# Patient Record
Sex: Male | Born: 1975 | Race: White | Hispanic: No | Marital: Single | Smoking: Never smoker
Health system: Southern US, Community
[De-identification: ages and names within clinical notes are randomized; demographics above are authoritative.]

## PROBLEM LIST (undated history)

## (undated) DIAGNOSIS — R002 Palpitations: Secondary | ICD-10-CM

## (undated) DIAGNOSIS — E669 Obesity, unspecified: Secondary | ICD-10-CM

## (undated) HISTORY — DX: Obesity, unspecified: E66.9

## (undated) HISTORY — DX: Palpitations: R00.2

## (undated) HISTORY — PX: LEG SURGERY: SHX1003

---

## 2015-09-27 ENCOUNTER — Encounter (HOSPITAL_COMMUNITY): Payer: Self-pay | Admitting: *Deleted

## 2015-09-27 ENCOUNTER — Emergency Department (HOSPITAL_COMMUNITY)
Admission: EM | Admit: 2015-09-27 | Discharge: 2015-09-27 | Disposition: A | Discharge status: Home / Self Care | Payer: Worker's Compensation | Attending: Emergency Medicine | Admitting: Emergency Medicine

## 2015-09-27 DIAGNOSIS — Y998 Other external cause status: Secondary | ICD-10-CM | POA: Insufficient documentation

## 2015-09-27 DIAGNOSIS — Z91040 Latex allergy status: Secondary | ICD-10-CM | POA: Diagnosis not present

## 2015-09-27 DIAGNOSIS — S0101XA Laceration without foreign body of scalp, initial encounter: Secondary | ICD-10-CM | POA: Insufficient documentation

## 2015-09-27 DIAGNOSIS — Y9389 Activity, other specified: Secondary | ICD-10-CM | POA: Insufficient documentation

## 2015-09-27 DIAGNOSIS — Y9289 Other specified places as the place of occurrence of the external cause: Secondary | ICD-10-CM | POA: Insufficient documentation

## 2015-09-27 DIAGNOSIS — W2209XA Striking against other stationary object, initial encounter: Secondary | ICD-10-CM | POA: Insufficient documentation

## 2015-09-27 DIAGNOSIS — S0990XA Unspecified injury of head, initial encounter: Secondary | ICD-10-CM | POA: Diagnosis present

## 2015-09-27 MED ORDER — LIDOCAINE-EPINEPHRINE-TETRACAINE (LET) SOLUTION
3.0000 mL | Freq: Once | NASAL | Status: AC
  Administered 2015-09-27: 3 mL via TOPICAL
  Filled 2015-09-27: qty 3

## 2015-09-27 MED ORDER — IBUPROFEN 400 MG PO TABS
800.0000 mg | ORAL_TABLET | Freq: Once | ORAL | Status: AC
  Administered 2015-09-27: 800 mg via ORAL
  Filled 2015-09-27: qty 2

## 2015-09-27 NOTE — ED Provider Notes (Signed)
CSN: [id number]     Arrival date & time 09/27/15  0032 History   First MD Initiated Contact with Patient 09/27/15 0103     Chief Complaint  Patient presents with  . Head Laceration     (Consider location/radiation/quality/duration/timing/severity/associated sxs/prior Treatment) The history is provided by the patient.   Jeremy Roberson is a 40 y.o. male presenting with head laceration.  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  He sustained a small laceration which bled copiously, but has now resolved with pressure application.  He denies loc, dizziness, nausea, vision changes, local weakness,  and denies neck pain. He does endorse a mild 3/10 headache since the event. His tetanus is utd.    No past medical history on file. Past Surgical History  Procedure Laterality Date  . Leg surgery     No family history on file. Social History  Substance Use Topics  . Smoking status: Never Smoker   . Smokeless tobacco: Not on file  . Alcohol Use: Yes    Review of Systems  Constitutional: Negative.   Eyes: Negative for visual disturbance.  Respiratory: Negative.   Cardiovascular: Negative.   Gastrointestinal: Negative.  Negative for nausea.  Musculoskeletal: Negative.  Negative for neck pain.  Skin: Positive for wound.  Neurological: Positive for headaches. Negative for weakness and numbness.      Allergies  Latex  Home Medications   Prior to Admission medications   Not on File   BP 139/86 mmHg  Pulse 101  Temp(Src) 98.6 F (37 C)  Resp 18  SpO2 95% Physical Exam  Constitutional: He is oriented to person, place, and time. He appears well-developed and well-nourished.  HENT:  Head: Normocephalic.  Eyes: EOM are normal.  Neck: Normal range of motion. Neck supple.  nontender to palpation  Cardiovascular: Normal rate.   Pulmonary/Chest: Effort normal.  Musculoskeletal: Normal range of motion.   Neurological: He is alert and oriented to person, place, and time. No sensory deficit.  Skin: Laceration noted.  1 cm laceration, approximated and hemostatic parietal scalp. No palpable skull deformity.    ED Course  Procedures (including critical care time)  LACERATION REPAIR Performed by: Burgess AmorIDOL, Maisley Hainsworth Authorized by: Burgess AmorIDOL, Leary Mcnulty Consent: Verbal consent obtained. Risks and benefits: risks, benefits and alternatives were discussed Consent given by: patient Patient identity confirmed: provided demographic data Prepped and Draped in normal sterile fashion Wound explored  Laceration Location: scalp  Laceration Length: 1 cm  No Foreign Bodies seen or palpated  Anesthesia: topical LET Local anesthetic: LET Anesthetic total: 3 ml applied topically  Irrigation method: syringe Amount of cleaning: standard after using betadine.  Wound site at an area of sparse hair growth amenable to suturing.  Skin closure: ethilon 4-0  Number of sutures: 3  Technique: simple interupted  Patient tolerance: Patient tolerated the procedure well with no immediate complications.  Labs Review Labs Reviewed - No data to display  Imaging Review No results found. I have personally reviewed and evaluated these images and lab results as part of my medical decision-making.   EKG Interpretation None      MDM   Final diagnoses:  Laceration of scalp, initial encounter    Wound care instructions given.  Pt advised to have sutures removed in 10 days,  Return here sooner for any signs of infection including redness, swelling, worse pain or drainage of pus.  Burgess Amor, PA-C 09/27/15 1358  Loren Racer, MD 09/28/15 [phone]

## 2015-09-27 NOTE — Discharge Instructions (Signed)

## 2015-09-27 NOTE — ED Notes (Signed)
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 ~3cm lac noted to top of head. Bleeding controlled at this time. No LOC

## 2015-09-27 NOTE — ED Notes (Signed)
PA at bedside for suturing 

## 2018-05-13 DIAGNOSIS — Z0000 Encounter for general adult medical examination without abnormal findings: Secondary | ICD-10-CM | POA: Diagnosis not present

## 2018-05-13 DIAGNOSIS — E669 Obesity, unspecified: Secondary | ICD-10-CM | POA: Diagnosis not present

## 2018-05-13 DIAGNOSIS — Z13220 Encounter for screening for lipoid disorders: Secondary | ICD-10-CM | POA: Diagnosis not present

## 2019-12-01 ENCOUNTER — Emergency Department: Payer: 59

## 2019-12-01 ENCOUNTER — Encounter: Payer: Self-pay | Admitting: *Deleted

## 2019-12-01 DIAGNOSIS — I4891 Unspecified atrial fibrillation: Secondary | ICD-10-CM | POA: Diagnosis not present

## 2019-12-01 DIAGNOSIS — R002 Palpitations: Secondary | ICD-10-CM | POA: Diagnosis present

## 2019-12-01 DIAGNOSIS — R079 Chest pain, unspecified: Secondary | ICD-10-CM | POA: Insufficient documentation

## 2019-12-01 DIAGNOSIS — Z91040 Latex allergy status: Secondary | ICD-10-CM | POA: Diagnosis not present

## 2019-12-01 LAB — BASIC METABOLIC PANEL
Anion gap: 9 (ref 5–15)
BUN: 16 mg/dL (ref 6–20)
CO2: 22 mmol/L (ref 22–32)
Calcium: 9.2 mg/dL (ref 8.9–10.3)
Chloride: 111 mmol/L (ref 98–111)
Creatinine, Ser: 1.04 mg/dL (ref 0.61–1.24)
GFR calc Af Amer: >60 mL/min (ref >60)
GFR calc non Af Amer: >60 mL/min (ref >60)
Glucose, Bld: 113 mg/dL — ABNORMAL HIGH (ref 70–99)
Potassium: 3.6 mmol/L (ref 3.5–5.1)
Sodium: 142 mmol/L (ref 135–145)

## 2019-12-01 LAB — CBC
HCT: 46.3 % (ref 39.0–52.0)
Hemoglobin: 16.4 g/dL (ref 13.0–17.0)
MCH: 32.2 pg (ref 26.0–34.0)
MCHC: 35.4 g/dL (ref 30.0–36.0)
MCV: 91 fL (ref 80.0–100.0)
Platelets: 239 10*3/uL (ref 150–400)
RBC: 5.09 MIL/uL (ref 4.22–5.81)
RDW: 12.9 % (ref 11.5–15.5)
WBC: 8.8 10*3/uL (ref 4.0–10.5)
nRBC: 0 % (ref 0.0–0.2)

## 2019-12-01 MED ORDER — SODIUM CHLORIDE 0.9% FLUSH: 3.0000 mL | Freq: Once | INTRAVENOUS | Status: DC

## 2019-12-01 NOTE — ED Triage Notes (Signed)
Pt to ED after feeling "fluttering" in his chest this afternoon. 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 Pt reports he has had multiple episodes like this in the past week and was seen by PCP today and referred to cardiology due to a normal assessment today without symptoms.   Pt reports when his HR is back to baseline her is feeling "fine" with a headache. But pt is having PVCs on the EKG in triage but is NSR at 97.

## 2019-12-02 ENCOUNTER — Other Ambulatory Visit: Payer: Self-pay

## 2019-12-02 ENCOUNTER — Ambulatory Visit (INDEPENDENT_AMBULATORY_CARE_PROVIDER_SITE_OTHER): Payer: 59

## 2019-12-02 ENCOUNTER — Encounter: Payer: Self-pay | Admitting: Cardiovascular Disease

## 2019-12-02 ENCOUNTER — Ambulatory Visit: Payer: 59

## 2019-12-02 ENCOUNTER — Emergency Department
Admission: EM | Admit: 2019-12-02 | Discharge: 2019-12-02 | Disposition: A | Discharge status: Home / Self Care | Payer: 59 | Attending: Emergency Medicine | Admitting: Emergency Medicine

## 2019-12-02 ENCOUNTER — Ambulatory Visit (INDEPENDENT_AMBULATORY_CARE_PROVIDER_SITE_OTHER): Payer: 59 | Admitting: Cardiovascular Disease

## 2019-12-02 VITALS — BP 120/80 | HR 53 | Ht 72.0 in | Wt 268.4 lb

## 2019-12-02 DIAGNOSIS — R9431 Abnormal electrocardiogram [ECG] [EKG]: Secondary | ICD-10-CM | POA: Diagnosis not present

## 2019-12-02 DIAGNOSIS — I479 Paroxysmal tachycardia, unspecified: Secondary | ICD-10-CM

## 2019-12-02 DIAGNOSIS — I480 Paroxysmal atrial fibrillation: Secondary | ICD-10-CM

## 2019-12-02 DIAGNOSIS — R002 Palpitations: Secondary | ICD-10-CM

## 2019-12-02 LAB — T4, FREE: Free T4: 1.09 ng/dL (ref 0.61–1.12)

## 2019-12-02 LAB — TROPONIN I (HIGH SENSITIVITY)
Troponin I (High Sensitivity): 6 ng/L (ref <18)
Troponin I (High Sensitivity): 6 ng/L (ref <18)

## 2019-12-02 LAB — MAGNESIUM: Magnesium: 2.4 mg/dL (ref 1.7–2.4)

## 2019-12-02 LAB — TSH: TSH: 1.802 u[IU]/mL (ref 0.350–4.500)

## 2019-12-02 MED ORDER — PROPRANOLOL HCL 20 MG PO TABS: 20.0000 mg | ORAL_TABLET | Freq: Three times a day (TID) | ORAL | 3 refills | Status: DC | PRN

## 2019-12-02 MED ORDER — ASPIRIN 81 MG PO CHEW
81.0000 mg | CHEWABLE_TABLET | Freq: Once | ORAL | Status: AC
  Administered 2019-12-02: 81 mg via ORAL
  Filled 2019-12-02: qty 1

## 2019-12-02 NOTE — Discharge Instructions (Signed)
Take a baby aspirin a day. Follow-up with cardiology within a week. Return to the emergency room for chest pain, shortness of breath or dizziness.

## 2019-12-02 NOTE — Progress Notes (Signed)
Cardiology Office Note  Date:  12/02/2019   ID:  Reeder, Brisby 01-09-1976, MRN [id number]  PCP:  Blair Heys, MD   Cc: Paroxysmal tachycardia  HPI:  Mr. Jeremy Roberson is a 44 year old gentleman with past medical history of Morbid obesity Presenting for evaluation of bradycardia, abnormal EKGs  Seen in emergency room today for chest pain and palpitations Works as a paramedic Pulses elevated at times Did an EKG showing normal sinus rhythm with narrow complex tachycardia episodes  Seen in outside emergency room 5 days ago with negative work-up and was referred to cardiology at that time  Last Tuesday, appreciated palpitations, Rate on tele strip 170 bpm Broke to 120 bpm, BP 130 systolic  Past Sunday, had nausea, pain back flank, scapula EKG NSR rate 60 bpm Went to [facility] hospital, TNT negative In the ER had PVCs, felt them  resting heart rate 44 to 50s, BP stable  Yesterday, 12/01/2019,  Tachycardia  Rhythm strips available,, copies have been made and placed in the chart  Weight down 30 pounds, change in diet  Possible apnea, Started walking for exercise and weight loss  EKG today showing sinus bradycardia rate 53 bpm no significant ST abnormality, nonspecific T wave abnormality 3 and aVF (unclear if lead placement issue) Recent EKG done in the emergency room shows no T wave abnormality  Recent telemetry strips reviewed that he has brought in showing narrow complex tachycardia Unable to exclude atrial fibrillation versus flutter, select strips concerning for atrial tachycardia even SVT with rate up to 170   PMH:   has no past medical history on file.  PSH:    Past Surgical History:  Procedure Laterality Date  . LEG SURGERY      Current Outpatient Medications  Medication Sig Dispense Refill  . ibuprofen (ADVIL) 200 MG tablet Take by mouth as needed.    . loratadine (CLARITIN) 10 MG tablet Take by mouth daily as needed.    . melatonin 5 MG TABS  Take 5 mg by mouth as needed.    . propranolol (INDERAL) 20 MG tablet Take 1 tablet (20 mg total) by mouth 3 (three) times daily as needed (As needed for fast heart rates). 270 tablet 3   No current facility-administered medications for this visit.     Allergies:   Latex   Social History:  The patient  reports that he has never smoked. He has never used smokeless tobacco. He reports current alcohol use. He reports that he does not use drugs.   Family History:   family history is not on file.    Review of Systems: Review of Systems  Constitutional: Negative.   HENT: Negative.   Respiratory: Negative.   Cardiovascular: Positive for chest pain and palpitations.  Gastrointestinal: Negative.   Musculoskeletal: Negative.   Neurological: Negative.   Psychiatric/Behavioral: Negative.   All other systems reviewed and are negative.   PHYSICAL EXAM: VS:  BP 120/80   Pulse 53   Ht 6' (1.829 m)   Wt (!) 268 lb 6.4 oz (121.7 kg)   BMI 36.40 kg/m  , BMI Body mass index is 36.4 kg/m. GEN: Well nourished, well developed, in no acute distress HEENT: normal Neck: no JVD, carotid bruits, or masses Cardiac: RRR; no murmurs, rubs, or gallops,no edema  Respiratory:  clear to auscultation bilaterally, normal work of breathing GI: soft, nontender, nondistended, + BS MS: no deformity or atrophy Skin: warm and dry, no rash Neuro:  Strength and sensation are intact Psych:  euthymic mood, full affect    Recent Labs: 12/01/2019: BUN 16; Creatinine, Ser 1.04; Hemoglobin 16.4; Magnesium 2.4; Platelets 239; Potassium 3.6; Sodium 142; TSH 1.802    Lipid Panel No results found for: CHOL, HDL, LDLCALC, TRIG    Wt Readings from Last 3 Encounters:  12/02/19 (!) 268 lb 6.4 oz (121.7 kg)       ASSESSMENT AND PLAN:  Problem List Items Addressed This Visit    None    Visit Diagnoses    Paroxysmal tachycardia (HCC)    -  Primary   Relevant Medications   propranolol (INDERAL) 20 MG tablet    Nonspecific abnormal electrocardiogram (ECG) (EKG)       Relevant Orders   EKG 12-Lead   Morbid obesity (HCC)         Paroxysmal tachycardia Rhythm strips reviewed, select strips concerning for atrial fibrillation/flutter Unable to exclude atrial tachycardia or even SVT For further evaluation we have ordered a Zio monitor Recent stressors include weight loss, strict diet, walking more, possible sleep apnea Recommend he discuss getting sleep apnea testing with primary care -We will give him propranolol 20 mg p.o. 3 times daily as needed to take as needed for tachycardia episodes -For SVT atrial fibrillation or flutter potentially could consider ablation -He has baseline bradycardia, taking medications such as beta-blocker, calcium channel blocker or antiarrhythmics on a regular basis would be challenging and would likely lead to symptoms  Obesity Has had significant weight loss in the past several weeks to months through dietary changes and walking  Abnormal EKG Nonspecific T wave abnormality on EKG Possibly from lead placement, prior EKG was normal  Disposition:   F/U 6 weeks   Total encounter time more than 45 minutes  Greater than 50% was spent in counseling and coordination of care with the patient    Signed, Dossie Arbour, M.D., Ph.D. [facility] [place], [place] [phone]

## 2019-12-02 NOTE — Addendum Note (Signed)
Addended by: Bryna Colander on: 12/02/2019 02:13 PM   Modules accepted: Orders

## 2019-12-02 NOTE — Patient Instructions (Addendum)
Zio monitor for paroxysmal tachycardia  Medication Instructions:  Propranolol 20 mg three times a day as needed for tachycardia  If you need a refill on your cardiac medications before your next appointment, please call your pharmacy.    Lab work: No new labs needed   If you have labs (blood work) drawn today and your tests are completely normal, you will receive your results only by: Marland Kitchen MyChart Message (if you have MyChart) OR . A paper copy in the mail If you have any lab test that is abnormal or we need to change your treatment, we will call you to review the results.   Testing/Procedures: Your physician has recommended that you wear a Zio monitor. This monitor is a medical device that records the heart's electrical activity. Doctors most often use these monitors to diagnose arrhythmias. Arrhythmias are problems with the speed or rhythm of the heartbeat. The monitor is a small device applied to your chest. You can wear one while you do your normal daily activities. While wearing this monitor if you have any symptoms to push the button and record what you felt. Once you have worn this monitor for the period of time provider prescribed (Usually 14 days), you will return the monitor device in the postage paid box. Once it is returned they will download the data collected and provide [place] with a report which the provider will then review and we will call you with those results. Important tips:  1. Avoid showering during the first 24 hours of wearing the monitor. 2. Avoid excessive sweating to help maximize wear time. 3. Do not submerge the device, no hot tubs, and no swimming pools. 4. Keep any lotions or oils away from the patch. 5. After 24 hours you may shower with the patch on. Take brief showers with your back facing the shower head.  6. Do not remove patch once it has been placed because that will interrupt data and decrease adhesive wear time. 7. Push the button when you have any  symptoms and write down what you were feeling. 8. Once you have completed wearing your monitor, remove and place into box which has postage paid and place in your outgoing mailbox.  9. If for some reason you have misplaced your box then call our office and we can provide another box and/or mail it off for you.         Follow-Up: At [facility], you and your health needs are our priority.  As part of our continuing mission to provide you with exceptional heart care, we have created designated Provider Care Teams.  These Care Teams include your primary Cardiologist (physician) and Advanced Practice Providers (APPs -  Physician Assistants and Nurse Practitioners) who all work together to provide you with the care you need, when you need it.  . You will need a follow up appointment in 6 to 8 weeks    . Providers on your designated Care Team:   . Nicolasa Ducking, NP . Eula Listen, PA-C . Marisue Ivan, PA-C  Any Other Special Instructions Will Be Listed Below (If Applicable).  For educational health videos Log in to : [email or web address] Or : [email or web address], password : [email or web address]

## 2019-12-02 NOTE — ED Notes (Signed)
Pt states chest pain 1/10 to the left chest. Pt states the provider wanted an EKG if he had chest pain. EKG captured and given to provider for review. Pt declined any interventions from RN

## 2019-12-02 NOTE — ED Provider Notes (Signed)
[facility] Emergency Department Provider Note  ____________________________________________  Time seen: Approximately 3:30 AM  I have reviewed the triage vital signs and the nursing notes.   HISTORY  Chief Complaint Atrial Fibrillation and Palpitations   HPI Jeremy Roberson is a 44 y.o. male no significant past medical history who presents for evaluation of chest pain and palpitations.  Patient has had several short-lived episodes over the last week of palpitations and chest pressure.  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  He checked his vitals and noticed that his pulse was elevated.  He did an EKG which showed A. fib with RVR.  Patient denies any personal history of such but his mother has a history of A. fib.  Patient had mild chest discomfort associated with it.  No dizziness, shortness of breath, nausea vomiting, diarrhea, fever chills, cough.  Patient denies history of alcohol abuse or smoking.  No personal or family history of blood clots, no recent travel immobilization, no leg pain or swelling, no hemoptysis or exogenous hormones.  Symptoms have resolved.   Patient recently seen at an outside emergency department 5 days ago for the same with negative work-up.  Was referred to cardiology but has not been able to schedule an appointment yet.  PMH Elevated BMI  Past Surgical History:  Procedure Laterality Date   LEG SURGERY      Prior to Admission medications   Not on File    Allergies Latex  FH Mother - afob  Social History Social History   Tobacco Use   Smoking status: Never Smoker   Smokeless tobacco: Never Used  Substance Use Topics   Alcohol use: Yes   Drug use: No    Review of Systems  Constitutional: Negative for fever. Eyes: Negative for visual changes. ENT: Negative for sore throat. Neck: No neck pain  Cardiovascular: + chest pain and  palpitations Respiratory: Negative for shortness of breath. Gastrointestinal: Negative for abdominal pain, vomiting or diarrhea. Genitourinary: Negative for dysuria. Musculoskeletal: Negative for back pain. Skin: Negative for rash. Neurological: Negative for headaches, weakness or numbness. Psych: No SI or HI  ____________________________________________   PHYSICAL EXAM:  VITAL SIGNS: ED Triage Vitals  Enc Vitals Group     BP 12/02/19 0315 126/85     Pulse Rate 12/02/19 0315 54     Resp 12/02/19 0315 14     Temp 12/02/19 0320 98 F (36.7 C)     Temp src --      SpO2 12/02/19 0315 100 %     Weight --      Height --      Head Circumference --      Peak Flow --      Pain Score 12/02/19 0315 0     Pain Loc --      Pain Edu? --      Excl. in GC? --     Constitutional: Alert and oriented. Well appearing and in no apparent distress. HEENT:      Head: Normocephalic and atraumatic.         Eyes: Conjunctivae are normal. Sclera is non-icteric.       Mouth/Throat: Mucous membranes are moist.       Neck: Supple with no signs of meningismus. Cardiovascular: Regular rate and rhythm. No murmurs, gallops, or rubs. 2+ symmetrical distal pulses are present in all extremities. No JVD. Respiratory: Normal respiratory effort. Lungs  are clear to auscultation bilaterally.  Gastrointestinal: Soft, non tender. Musculoskeletal:  No edema, cyanosis, or erythema of extremities. Neurologic: Normal speech and language. Face is symmetric. Moving all extremities. No gross focal neurologic deficits are appreciated. Skin: Skin is warm, dry and intact. No rash noted. Psychiatric: Mood and affect are normal. Speech and behavior are normal.  ____________________________________________   LABS (all labs ordered are listed, but only abnormal results are displayed)  Labs Reviewed  BASIC METABOLIC PANEL - Abnormal; Notable for the following components:      Result Value   Glucose, Bld 113 (*)    All  other components within normal limits  CBC  TSH  T4, FREE  MAGNESIUM  TROPONIN I (HIGH SENSITIVITY)  TROPONIN I (HIGH SENSITIVITY)   ____________________________________________  EKG  ED ECG REPORT I, Nita Sickle, the attending physician, personally viewed and interpreted this ECG.  20:38 -normal sinus rhythm, rate of 85, normal intervals, normal axis, no ST elevations or depressions.  Normal EKG.  20:40 -sinus tachycardia with frequent PACs, normal intervals, normal axis, no ST elevations or depressions. ____________________________________________  RADIOLOGY  I have personally reviewed the images performed during this visit and I agree with the Radiologist's read.   Interpretation by Radiologist:  DG Chest 2 View  Result Date: 12/01/2019 CLINICAL DATA:  Cardiac palpitations EXAM: CHEST - 2 VIEW COMPARISON:  None. FINDINGS: The heart size and mediastinal contours are within normal limits. Both lungs are clear. The visualized skeletal structures are unremarkable. IMPRESSION: No active cardiopulmonary disease. Electronically Signed   By: Alcide Clever M.D.   On: 12/01/2019 21:17     ____________________________________________   PROCEDURES  Procedure(s) performed:yes .1-3 Lead EKG Interpretation Performed by: Nita Sickle, MD Authorized by: Nita Sickle, MD     Interpretation: normal     ECG rate assessment: bradycardic     Rhythm: sinus bradycardia     Ectopy: PAC     Critical Care performed:  None ____________________________________________   INITIAL IMPRESSION / ASSESSMENT AND PLAN / ED COURSE  44 y.o. male no significant past medical history who presents for evaluation of chest pain and palpitations.  Patient with intermittent symptoms for the last 5 days.  Had one today while at work and had an EKG showing A. fib with RVR.  At this time patient is back to normal rhythm.  He is sinus bradycardic on telemetry.  EKG showing frequent PACs but no  A. fib.  No electrolyte derangements.  Low suspicion for PE with no persistent chest pain, shortness of breath, hemoptysis, tachycardia, tachypnea, hypoxia, or any other risk factors.  Will check thyroid studies.  Labs with no evidence of dehydration, diabetes, significant electrolyte derangements, cardiac ischemia.  Patient placed on telemetry for close monitoring.  Old medical records reviewed.  Per Chads-Vasc 2 score no indication for anticoagulation. Will start patient on ASA. Will refer to cardiology for Holter monitoring.   _________________________ 4:28 AM on 12/02/2019 -----------------------------------------  Thyroid studies within normal limits. Magnesium is normal. Patient remains on sinus rhythm. Will discharge home with follow-up with cardiology. Discussed my standard return precautions    _____________________________________________ Please note:  Patient was evaluated in Emergency Department today for the symptoms described in the history of present illness. Patient was evaluated in the context of the global COVID-19 pandemic, which necessitated consideration that the patient might be at risk for infection with the SARS-CoV-2 virus that causes COVID-19. Institutional protocols and algorithms that pertain to the evaluation of patients at risk for COVID-19  are in a state of rapid change based on information released by regulatory bodies including the CDC and federal and state organizations. These policies and algorithms were followed during the patient's care in the ED.  Some ED evaluations and interventions may be delayed as a result of limited staffing during the pandemic.   [place] Controlled Substance Database was reviewed by me. ____________________________________________   FINAL CLINICAL IMPRESSION(S) / ED DIAGNOSES   Final diagnoses:  Heart palpitations  Paroxysmal atrial fibrillation (HCC)      NEW MEDICATIONS STARTED DURING THIS VISIT:  ED Discharge Orders    None        Note:  This document was prepared using Dragon voice recognition software and may include unintentional dictation errors.    Don Perking, [place], MD 12/02/19 [phone]

## 2019-12-02 NOTE — ED Notes (Signed)
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 Pt states on and off chest pain and heart fluttering for the last 1-2 weeks.  Pt on cardiac, bp and pulse ox monitor.

## 2019-12-12 ENCOUNTER — Telehealth: Payer: Self-pay | Admitting: Cardiovascular Disease

## 2019-12-12 NOTE — Telephone Encounter (Signed)
I rhythm calling to have zio registered applied 12/02/19   Reference   [id number]

## 2019-12-13 NOTE — Telephone Encounter (Signed)
Monitor enrolled and information updated.

## 2020-01-04 DIAGNOSIS — I471 Supraventricular tachycardia, unspecified: Secondary | ICD-10-CM

## 2020-01-04 HISTORY — DX: Supraventricular tachycardia: I47.1

## 2020-01-04 HISTORY — DX: Supraventricular tachycardia, unspecified: I47.10

## 2020-01-06 ENCOUNTER — Ambulatory Visit: Payer: Self-pay | Admitting: Cardiology

## 2020-01-18 ENCOUNTER — Telehealth: Payer: Self-pay

## 2020-01-18 NOTE — Telephone Encounter (Signed)
Attempted to call patient. LMTCB 01/18/2020 ° ° °

## 2020-01-18 NOTE — Telephone Encounter (Signed)
-----   Message from Antonieta Iba, MD sent at 01/18/2020 11:51 AM EDT ----- Event monitor Rare episodes of arrhythmia, No further work-up needed at this time As rare arrhythmia lasting such a short time, propranolol as needed may not help very much.  Certainly welcome to take the propranolol for sequential episodes or persistent runs  Results from zio reassuring No atrial fibrillation or flutter

## 2020-01-19 NOTE — Telephone Encounter (Signed)
2nd attempt to call results. No answer. Left message to call back.

## 2020-01-19 NOTE — Telephone Encounter (Signed)
Patient returning call for monitor results. 

## 2020-01-19 NOTE — Telephone Encounter (Signed)
The patient has been notified of the result and verbalized understanding.  All questions (if any) were answered. Sherlynn Stalls Carroll Ranney, RN 01/19/2020 11:30 AM    Patient would like to cancel follow up appointment since no further testing is needed. Cancelled appointment and advised I will make Dr Mariah Milling aware and if he recommends a folluw up appointment we'll call him back.

## 2020-01-23 ENCOUNTER — Ambulatory Visit: Payer: 59 | Admitting: Family

## 2020-03-04 ENCOUNTER — Emergency Department (HOSPITAL_COMMUNITY): Payer: 59

## 2020-03-04 ENCOUNTER — Encounter (HOSPITAL_COMMUNITY): Payer: Self-pay

## 2020-03-04 ENCOUNTER — Other Ambulatory Visit: Payer: Self-pay

## 2020-03-04 ENCOUNTER — Emergency Department (HOSPITAL_COMMUNITY)
Admission: EM | Admit: 2020-03-04 | Discharge: 2020-03-04 | Disposition: A | Discharge status: Home / Self Care | Payer: 59 | Attending: Emergency Medicine | Admitting: Emergency Medicine

## 2020-03-04 DIAGNOSIS — I4891 Unspecified atrial fibrillation: Secondary | ICD-10-CM

## 2020-03-04 DIAGNOSIS — R000 Tachycardia, unspecified: Secondary | ICD-10-CM | POA: Diagnosis present

## 2020-03-04 DIAGNOSIS — Z91040 Latex allergy status: Secondary | ICD-10-CM | POA: Diagnosis not present

## 2020-03-04 DIAGNOSIS — R002 Palpitations: Secondary | ICD-10-CM

## 2020-03-04 LAB — CBC WITH DIFFERENTIAL/PLATELET
Abs Immature Granulocytes: 0.02 10*3/uL (ref 0.00–0.07)
Basophils Absolute: 0 10*3/uL (ref 0.0–0.1)
Basophils Relative: 0 %
Eosinophils Absolute: 0.2 10*3/uL (ref 0.0–0.5)
Eosinophils Relative: 2 %
HCT: 44.3 % (ref 39.0–52.0)
Hemoglobin: 15.3 g/dL (ref 13.0–17.0)
Immature Granulocytes: 0 %
Lymphocytes Relative: 21 %
Lymphs Abs: 1.7 10*3/uL (ref 0.7–4.0)
MCH: 32.1 pg (ref 26.0–34.0)
MCHC: 34.5 g/dL (ref 30.0–36.0)
MCV: 93.1 fL (ref 80.0–100.0)
Monocytes Absolute: 0.8 10*3/uL (ref 0.1–1.0)
Monocytes Relative: 10 %
Neutro Abs: 5.6 10*3/uL (ref 1.7–7.7)
Neutrophils Relative %: 67 %
Platelets: 234 10*3/uL (ref 150–400)
RBC: 4.76 MIL/uL (ref 4.22–5.81)
RDW: 13.2 % (ref 11.5–15.5)
WBC: 8.3 10*3/uL (ref 4.0–10.5)
nRBC: 0 % (ref 0.0–0.2)

## 2020-03-04 LAB — BASIC METABOLIC PANEL
Anion gap: 10 (ref 5–15)
BUN: 14 mg/dL (ref 6–20)
CO2: 20 mmol/L — ABNORMAL LOW (ref 22–32)
Calcium: 8.4 mg/dL — ABNORMAL LOW (ref 8.9–10.3)
Chloride: 110 mmol/L (ref 98–111)
Creatinine, Ser: 1.08 mg/dL (ref 0.61–1.24)
GFR, Estimated: >60 mL/min (ref >60)
Glucose, Bld: 91 mg/dL (ref 70–99)
Potassium: 4 mmol/L (ref 3.5–5.1)
Sodium: 140 mmol/L (ref 135–145)

## 2020-03-04 LAB — TROPONIN I (HIGH SENSITIVITY): Troponin I (High Sensitivity): 5 ng/L (ref <18)

## 2020-03-04 LAB — MAGNESIUM: Magnesium: 2.2 mg/dL (ref 1.7–2.4)

## 2020-03-04 MED ORDER — LACTATED RINGERS IV BOLUS
1000.0000 mL | Freq: Once | INTRAVENOUS | Status: AC
  Administered 2020-03-04: 1000 mL via INTRAVENOUS

## 2020-03-04 NOTE — ED Provider Notes (Signed)
[facility] EMERGENCY DEPARTMENT Provider Note   CSN: [id number] Arrival date & time: 03/04/20  2012     History Chief Complaint  Patient presents with  . Irregular Heart Beat    Jeremy Roberson is a 44 y.o. male.  Patient reports history of tachyarrhythmia, either SVT or A. fib RVR.  He reports feeling some PVCs as he was getting ready for work, and on arrival to work, he had sudden onset nausea and lightheadedness and just in general did not feel well.  His heart rate was 140 and then just became really tachycardic up to the 204 max heart rate.  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  His strips looked concerning for SVT, but he would spontaneously convert to normal sinus rhythm with a rate of around 80 bpm.  He initially took his home propranolol, and when that did not help his arrhythmia, they provided 6 mg IV adenosine and 4 mg IV Zofran for nausea.  When that did not work they gave a bolus of 20 mg diltiazem, and when his pressures tolerated that, they provided another 10 mg IV diltiazem.  Just prior to arrival, he was still tachycardic, so they are about to start a drip but deferred to bring him into the department.  On arrival in the department, he is back in normal sinus rhythm.  The history is provided by the patient.  Palpitations Palpitations quality:  Fast Onset quality:  Sudden Duration: 1.5hrs. Timing:  Constant Progression:  Waxing and waning Chronicity:  Recurrent Relieved by:  Nothing Worsened by:  Nothing Ineffective treatments: Home propranolol, IV adenosine, IV diltiazem. Associated symptoms: nausea   Associated symptoms: no back pain, no chest pain, no cough, no shortness of breath and no vomiting        History reviewed. No pertinent past medical history.  There are no problems to display for this patient.   Past Surgical History:  Procedure Laterality Date  . LEG SURGERY         History reviewed. No pertinent family  history.  Social History   Tobacco Use  . Smoking status: Never Smoker  . Smokeless tobacco: Never Used  Substance Use Topics  . Alcohol use: Yes  . Drug use: No    Home Medications Prior to Admission medications   Medication Sig Start Date End Date Taking? Authorizing Provider  ibuprofen (ADVIL) 200 MG tablet Take by mouth as needed.    [provider]  loratadine (CLARITIN) 10 MG tablet Take by mouth daily as needed.    [provider]  melatonin 5 MG TABS Take 5 mg by mouth as needed.    [provider]  propranolol (INDERAL) 20 MG tablet Take 1 tablet (20 mg total) by mouth 3 (three) times daily as needed (As needed for fast heart rates). 12/02/19   Antonieta Iba, MD    Allergies    Latex  Review of Systems   Review of Systems  Constitutional: Negative for chills and fever.  HENT: Negative for ear pain and sore throat.   Eyes: Negative for pain and visual disturbance.  Respiratory: Negative for cough and shortness of breath.   Cardiovascular: Positive for palpitations. Negative for chest pain.  Gastrointestinal: Positive for nausea. Negative for abdominal pain and vomiting.  Genitourinary: Negative for dysuria and hematuria.  Musculoskeletal: Negative for arthralgias and back pain.  Skin: Negative for color change and rash.  Neurological: Positive for light-headedness. Negative for seizures  and syncope.  All other systems reviewed and are negative.   Physical Exam Updated Vital Signs BP 112/83   Pulse 73   Temp 98.5 F (36.9 C) (Oral)   Resp 17   Ht 6' (1.829 m)   Wt 122.5 kg   SpO2 95%   BMI 36.62 kg/m   Physical Exam Vitals and nursing note reviewed.  Constitutional:      Appearance: He is well-developed. He is obese. He is not ill-appearing, toxic-appearing or diaphoretic.  HENT:     Head: Normocephalic and atraumatic.     Mouth/Throat:     Mouth: Mucous membranes are moist.     Pharynx: Oropharynx is clear.  Eyes:      Conjunctiva/sclera: Conjunctivae normal.  Cardiovascular:     Rate and Rhythm: Normal rate and regular rhythm.     Heart sounds: No murmur heard.  No gallop.   Pulmonary:     Effort: Pulmonary effort is normal. No respiratory distress.     Breath sounds: Normal breath sounds.  Abdominal:     Palpations: Abdomen is soft.     Tenderness: There is no abdominal tenderness.  Musculoskeletal:     Cervical back: Neck supple.  Skin:    General: Skin is warm and dry.  Neurological:     Mental Status: He is alert and oriented to person, place, and time.     ED Results / Procedures / Treatments   Labs (all labs ordered are listed, but only abnormal results are displayed) Labs Reviewed  BASIC METABOLIC PANEL - Abnormal; Notable for the following components:      Result Value   CO2 20 (*)    Calcium 8.4 (*)    All other components within normal limits  CBC WITH DIFFERENTIAL/PLATELET  MAGNESIUM  TROPONIN I (HIGH SENSITIVITY)    EKG EKG Interpretation  Date/Time:  Sunday March 04 2020 20:18:52 EDT Ventricular Rate:  76 PR Interval:    QRS Duration: 89 QT Interval:  366 QTC Calculation: 412 R Axis:   2 Text Interpretation: Sinus rhythm Normal ECG Confirmed by Geoffery Lyons ([id number]) on 03/04/2020 8:41:52 PM   Radiology DG Chest Portable 1 View  Result Date: 03/04/2020 CLINICAL DATA:  SVT EXAM: PORTABLE CHEST 1 VIEW COMPARISON:  12/01/2019 FINDINGS: The heart size and mediastinal contours are within normal limits. Both lungs are clear. The visualized skeletal structures are unremarkable. IMPRESSION: No active disease. Electronically Signed   By: Charlett Nose M.D.   On: 03/04/2020 21:14    Procedures Procedures (including critical care time)  Medications Ordered in ED Medications  lactated ringers bolus 1,000 mL (1,000 mLs Intravenous New Bag/Given 03/04/20 2059)    ED Course  I have reviewed the triage vital signs and the nursing notes.  Pertinent labs & imaging  results that were available during my care of the patient were reviewed by me and considered in my medical decision making (see chart for details).    MDM Rules/Calculators/A&P     CHA2DS2-VASc Score: 0                     The patient is a 44yo male, PMH prior episodes of tachyarrhythmia, who presents to the ED via EMS for palpitations.  On my initial evaluation, the patient is hemodynamically stable, afebrile, nontoxic-appearing. Physical exam remarkable for normal heart rate at this time.  Reviewed patient's records.  Patient's episodes of tachyarrhythmias were difficult even for the cardiologist to interpret, differentials including SVT, atrial fibrillation,  atrial flutter.  Patient had been prescribed propranolol as needed due to bradycardia with propranolol, but tonight it seemed to have no effect on his symptoms.  Reviewed EMS EKG strips.  His rhythm on the strips is quite tachycardic but also quite irregular.  It does not appear to be sinus tachycardia, because they are not regular P waves.  Due to the irregularity and tachycardia, we are most concerned for A. fib RVR.  EKG obtained, shows normal sinus rhythm with no ischemic changes, no prolonged or abnormal intervals.  Labs obtained and unremarkable.  CXR also unremarkable.  Since patient has felt well lately, low suspicion for emergent underlying etiology to trigger arrhythmia.  On multiple reevaluations, patient is still in normal sinus rhythm and has resolution of symptoms.  Feel patient stable for discharge with close outpatient follow-up.  CHA2DS2-VASc low, do not feel patient needs empiric anticoagulation.  Advised patient of concern for possibility of A. fib RVR. Advised treatment of symptoms with continuing home propranolol as needed as directed by his cardiologist. Recommended follow-up with PCP and his cardiologist in the next couple days. Strict return precautions provided.  Patient in agreement with plan.  All questions and  concerns addressed.  Patient discharged in stable condition.   The care of this patient was overseen by Dr. Judd Lien, who agreed with evaluation and plan of care.   Final Clinical Impression(s) / ED Diagnoses Final diagnoses:  Palpitations  Atrial fibrillation with RVR [facility]    Rx / DC Orders ED Discharge Orders    None       Loletha Carrow, MD 03/04/20 2229    Geoffery Lyons, MD 03/04/20 2234

## 2020-03-04 NOTE — ED Triage Notes (Signed)
Pt arrived via EMS from work. Pt states he felt nausea prior to arriving to work and once he arrived to work he felt like his heart was "fluttering." Pt states he checked his pulse at work and it was 140-180bpm. Pt states he took his propanolol when he became symptomatic at work but his rapid heart rate and headache shortly returned. Pt was in afib with RVR on 12 lead taken by EMS. Pt was given 30mg  of diltiazem, 4mg  Zofran,  and 6mg  of adenosine.

## 2020-03-06 ENCOUNTER — Other Ambulatory Visit: Payer: Self-pay

## 2020-03-06 ENCOUNTER — Ambulatory Visit: Payer: 59 | Admitting: Family

## 2020-03-06 ENCOUNTER — Encounter: Payer: Self-pay | Admitting: Family

## 2020-03-06 VITALS — BP 110/70 | HR 65 | Ht 72.0 in | Wt 281.2 lb

## 2020-03-06 DIAGNOSIS — R0683 Snoring: Secondary | ICD-10-CM

## 2020-03-06 DIAGNOSIS — I479 Paroxysmal tachycardia, unspecified: Secondary | ICD-10-CM

## 2020-03-06 DIAGNOSIS — I471 Supraventricular tachycardia: Secondary | ICD-10-CM

## 2020-03-06 DIAGNOSIS — R0600 Dyspnea, unspecified: Secondary | ICD-10-CM | POA: Diagnosis not present

## 2020-03-06 DIAGNOSIS — G4730 Sleep apnea, unspecified: Secondary | ICD-10-CM

## 2020-03-06 DIAGNOSIS — R002 Palpitations: Secondary | ICD-10-CM | POA: Diagnosis not present

## 2020-03-06 DIAGNOSIS — Z6838 Body mass index (BMI) 38.0-38.9, adult: Secondary | ICD-10-CM

## 2020-03-06 NOTE — Patient Instructions (Addendum)
Medication Instructions:  No changes.  *If you need a refill on your cardiac medications before your next appointment, please call your pharmacy*   Lab Work: None ordered.  If you have labs (blood work) drawn today and your tests are completely normal, you will receive your results only by: Marland Kitchen MyChart Message (if you have MyChart) OR . A paper copy in the mail If you have any lab test that is abnormal or we need to change your treatment, we will call you to review the results.   Testing/Procedures: Your physician has requested that you have an echocardiogram. Echocardiography is a painless test that uses sound waves to create images of your heart. It provides your doctor with information about the size and shape of your heart and how well your heart's chambers and valves are working. This procedure takes approximately one hour. There are no restrictions for this procedure.  Your physician has recommended that you have a sleep study. This test records several body functions during sleep, including: brain activity, eye movement, oxygen and carbon dioxide blood levels, heart rate and rhythm, breathing rate and rhythm, the flow of air through your mouth and nose, snoring, body muscle movements, and chest and belly movement. (Our [address] office sleep coordinator will call you with scheduling information.)   Follow-Up: At [facility], you and your health needs are our priority.  As part of our continuing mission to provide you with exceptional heart care, we have created designated Provider Care Teams.  These Care Teams include your primary Cardiologist (physician) and Advanced Practice Providers (APPs -  Physician Assistants and Nurse Practitioners) who all work together to provide you with the care you need, when you need it.  We recommend signing up for the patient portal called "MyChart".  Sign up information is provided on this After Visit Summary.  MyChart is used to connect with  patients for Virtual Visits (Telemedicine).  Patients are able to view lab/test results, encounter notes, upcoming appointments, etc.  Non-urgent messages can be sent to your provider as well.   To learn more about what you can do with MyChart, go to [email or web address].      Your next appointment:    You have been referred to Dr. Lalla Brothers, MD EP (To be scheduled after the Echo)   The format for your next appointment:   In Person  Provider:    Dr. Lalla Brothers    Other Instructions    Recommend reducing intake of caffeine and alcohol. Recommend drinking water throughout your day to prevent dehydration which can triggers arrhythmias.
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
## 2020-03-07 ENCOUNTER — Ambulatory Visit (INDEPENDENT_AMBULATORY_CARE_PROVIDER_SITE_OTHER): Payer: 59

## 2020-03-07 ENCOUNTER — Telehealth: Payer: Self-pay | Admitting: *Deleted

## 2020-03-07 DIAGNOSIS — R0600 Dyspnea, unspecified: Secondary | ICD-10-CM

## 2020-03-07 LAB — ECHOCARDIOGRAM COMPLETE
AR max vel: 4.03 cm2
AV Area VTI: 3.79 cm2
AV Area mean vel: 3.24 cm2
AV Mean grad: 2 mmHg
AV Peak grad: 3.8 mmHg
Ao pk vel: 0.97 m/s
Area-P 1/2: 5.38 cm2
S' Lateral: 2.9 cm
Single Plane A4C EF: 62.7 %

## 2020-03-07 MED ORDER — PERFLUTREN LIPID MICROSPHERE
1.0000 mL | INTRAVENOUS | Status: AC | PRN
  Administered 2020-03-07: 2 mL via INTRAVENOUS

## 2020-03-07 NOTE — Telephone Encounter (Signed)
-----   Message from Annia Belt, RN sent at 03/06/2020 12:03 PM EDT ----- Regarding: Pt needs a sleep study Pt needs to be scheduled for a sleep study. The order has been placed under Dr. Mayford Knife.   Thanks,  Lanny Hurst, RN

## 2020-03-08 ENCOUNTER — Telehealth: Payer: Self-pay | Admitting: *Deleted

## 2020-03-08 NOTE — Telephone Encounter (Signed)
No answer. Left message to call back.   

## 2020-03-08 NOTE — Telephone Encounter (Signed)
The patient has been notified of the result and verbalized understanding.  All questions (if any) were answered. Sherlynn Stalls Kaesha Kirsch, RN 03/08/2020 3:03 PM

## 2020-03-08 NOTE — Telephone Encounter (Signed)
-----   Message from Alver Sorrow, NP sent at 03/08/2020  7:59 AM EDT ----- Echocardiogram shows normal heart pumping function. No significant valvular abnormalities. No structural abnormalities which would be causing his palpitations. Great result! Follow up with Dr. Lalla Brothers as scheduled 03/20/20.

## 2020-03-14 ENCOUNTER — Telehealth: Payer: Self-pay | Admitting: Cardiology

## 2020-03-14 NOTE — Telephone Encounter (Signed)
Split night pending approval with [facility] - [id number]

## 2020-03-16 NOTE — Telephone Encounter (Signed)
Buddy Duty, CMA PA approved and ready to schedule #[id number] for DOS 03-14-20 thru 06-14-20

## 2020-03-16 NOTE — Telephone Encounter (Signed)
PA approved - #[id number] for DOS 03-14-20 - 06-14-20.

## 2020-03-20 ENCOUNTER — Institutional Professional Consult (permissible substitution): Payer: 59 | Admitting: Cardiology

## 2020-03-23 NOTE — Telephone Encounter (Addendum)
Patient is scheduled for lab study on 04/22/20. Patient understands his sleep study will be done at [facility] sleep lab. Patient understands he will receive a sleep packet in a week or so. Patient understands to call if he does not receive the sleep packet in a timely manner.  Left detailed message on voicemail with date and time of test and informed patient to call back to confirm or reschedule.

## 2020-04-02 ENCOUNTER — Telehealth: Payer: Self-pay | Admitting: Cardiovascular Disease

## 2020-04-02 NOTE — Telephone Encounter (Signed)
Patient states he takes Propranolol PRN and thinks he should take this daily. States he is having "episodes" almost daily. States he is having elavated HR(160-190) and headache, states this fluctuates throughout the day. States when he takes his Propanolol after 20 minutes his symptoms subside. Please call to discuss.

## 2020-04-02 NOTE — Telephone Encounter (Signed)
Spoke with patient and he reports heart rate 120-130 in the morning. When he was watching the basketball on TV and no problems but after game when he was walking it got up to 130 and once he got back in vehicle it got up to 160-190. He takes medication for 120-130 when he is getting dressed for work. He takes propranolol when he has these episodes. Advised that he take propranolol three times a day as needed for the elevated heart rates. Recommended that he monitor heart rates and blood pressures during these episodes to check before and 2 hours after. He verbalized understanding of our conversation, agreement with plan, and encouraged him to keep a log so that when he comes in for follow up this can be very helpful. No further needs at this time.

## 2020-04-03 NOTE — Telephone Encounter (Signed)
Patient called to reschedule his appointment for 05/06/2020.

## 2020-04-04 NOTE — Telephone Encounter (Signed)
If he can hang in there for a little while longer until he can see Dr. Lalla Brothers Maybe we can add him to the wait list for Dr. Lalla Brothers for any cancellations I am hesitant to add antiarrhythmic such as flecainide without Dr. Lalla Brothers weighing in first He does have tachybradycardia making his management difficult We will defer to EP, perhaps might be a candidate for ablation

## 2020-04-05 ENCOUNTER — Ambulatory Visit: Payer: 59 | Admitting: Cardiology

## 2020-04-05 ENCOUNTER — Encounter: Payer: Self-pay | Admitting: Cardiology

## 2020-04-05 ENCOUNTER — Other Ambulatory Visit: Payer: Self-pay

## 2020-04-05 VITALS — BP 120/82 | HR 57 | Ht 72.0 in | Wt 277.4 lb

## 2020-04-05 DIAGNOSIS — Z5181 Encounter for therapeutic drug level monitoring: Secondary | ICD-10-CM | POA: Diagnosis not present

## 2020-04-05 DIAGNOSIS — R000 Tachycardia, unspecified: Secondary | ICD-10-CM | POA: Insufficient documentation

## 2020-04-05 DIAGNOSIS — Z79899 Other long term (current) drug therapy: Secondary | ICD-10-CM

## 2020-04-05 MED ORDER — FLECAINIDE ACETATE 50 MG PO TABS: 50.0000 mg | ORAL_TABLET | Freq: Two times a day (BID) | ORAL | 3 refills | Status: DC

## 2020-04-05 MED ORDER — METOPROLOL SUCCINATE ER 25 MG PO TB24: 25.0000 mg | ORAL_TABLET | Freq: Two times a day (BID) | ORAL | 3 refills | Status: DC

## 2020-04-05 NOTE — Telephone Encounter (Signed)
EP scheduler attempted to contact Pt today to offer appt today at the [facility] office.  No answer and Pt has not returned call to office.

## 2020-04-05 NOTE — Progress Notes (Signed)
Electrophysiology Office Note:    Date:  04/05/2020   ID:  Jeremy, Roberson [birth year], MRN [id number]  PCP:  Blair Heys, MD  [facility] Cardiologist:  Julien Nordmann, MD  [facility] Electrophysiologist:  None   Referring MD: Alver Sorrow, NP   Chief Complaint: Palpitations/tachycardia  History of Present Illness:    Jeremy Roberson is a 44 y.o. male who presents for an evaluation of palpitations/tachycardia at the request of Gillian Shields, NP.  [name] has no significant past medical history.  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  He last saw Gillian Shields on March 06, 2020.  He tells me he never had any palpitations until July of this year.  He tells me that he would reliably experience the onset of palpitations once his heart rate would increase to about 130 bpm.  Once his heart rate would reach 130 bpm, the heart rate would quickly increase to 160 or 170 bpm.  Once there, the heart rate would stay elevated for minutes to half an hour at a time.  While in the tachycardia up, the patient would have diaphoresis and nausea.  He sometimes will experience chest pain that would radiate to his left shoulder blade.  He never had a syncopal episode but would feel lightheaded during the tachycardia episodes.  He tells me during one of the episodes his EMS supervisor saw him and thought that he was relatively hypotensive with systolic blood pressures in the 90s.  Adenosine was given which brought the heart rate down for a few seconds and then the heart rate immediately went back to the 160s.  IV Cardizem was given (20 mg first followed by another 10 mg dose) which successfully returned his heart rate back to normal.  He tells me he has no reliable triggers for the episodes other than when he gets his heart rate to 130 bpm.  He is stopped drinking caffeine.  He has a sleep study scheduled.   Past Medical History:  Diagnosis Date  . Obesity   . Palpitations     . Paroxysmal SVT (supraventricular tachycardia) (HCC) 01/2020   (a) 2 episdoes by monitor 01/2020    Past Surgical History:  Procedure Laterality Date  . LEG SURGERY      Current Medications: Current Meds  Medication Sig  . ibuprofen (ADVIL) 200 MG tablet Take by mouth as needed.  . loratadine (CLARITIN) 10 MG tablet Take by mouth daily as needed.  . melatonin 5 MG TABS Take 5 mg by mouth as needed.  . [DISCONTINUED] propranolol (INDERAL) 20 MG tablet Take 1 tablet (20 mg total) by mouth 3 (three) times daily as needed (As needed for fast heart rates).     Allergies:   Latex   Social History   Socioeconomic History  . Marital status: Single    Spouse name: Not on file  . Number of children: Not on file  . Years of education: Not on file  . Highest education level: Not on file  Occupational History  . Not on file  Tobacco Use  . Smoking status: Never Smoker  . Smokeless tobacco: Never Used  Vaping Use  . Vaping Use: Never used  Substance and Sexual Activity  . Alcohol use: Yes  . Drug use: No  . Sexual activity: Not on file  Other Topics Concern  . Not on file  Social History Narrative  . Not on file   Social Determinants of Health  Financial Resource Strain:   . Difficulty of Paying Living Expenses: Not on file  Food Insecurity:   . Worried About Programme researcher, broadcasting/film/video in the Last Year: Not on file  . Ran Out of Food in the Last Year: Not on file  Transportation Needs:   . Lack of Transportation (Medical): Not on file  . Lack of Transportation (Non-Medical): Not on file  Physical Activity:   . Days of Exercise per Week: Not on file  . Minutes of Exercise per Session: Not on file  Stress:   . Feeling of Stress : Not on file  Social Connections:   . Frequency of Communication with Friends and Family: Not on file  . Frequency of Social Gatherings with Friends and Family: Not on file  . Attends Religious Services: Not on file  . Active Member of Clubs or  Organizations: Not on file  . Attends Banker Meetings: Not on file  . Marital Status: Not on file     Family History: The patient's family history includes Atrial fibrillation in his mother.  ROS:   Please see the history of present illness.    All other systems reviewed and are negative.  EKGs/Labs/Other Studies Reviewed:    The following studies were reviewed today: Echo, prior notes, ZIO  March 07, 2020 echo personally reviewed Left ventricular function normal, 60% Right ventricular function normal No significant valvular abnormalities  EKG:  The ekg ordered today demonstrates sinus rhythm at 57 bpm.  No evidence of preexcitation.  PR interval 200 ms.  December 29, 2019 ZIO personally reviewed Narrow complex tachycardia at a heart rate of approximately 200 bpm that intermittently is aberrantly conducted    EKG on December 02, 2019 personally reviewed shows sinus rhythm intermixed with episodes of atrial tachycardia.  P wave axis clearly changes in lead V1 during tachycardia.      Recent Labs: 12/01/2019: TSH 1.802 03/04/2020: BUN 14; Creatinine, Ser 1.08; Hemoglobin 15.3; Magnesium 2.2; Platelets 234; Potassium 4.0; Sodium 140  Recent Lipid Panel No results found for: CHOL, TRIG, HDL, CHOLHDL, VLDL, LDLCALC, LDLDIRECT  Physical Exam:    VS:  BP 120/82   Pulse (!) 57   Ht 6' (1.829 m)   Wt 277 lb 6.4 oz (125.8 kg)   SpO2 96%   BMI 37.62 kg/m     Wt Readings from Last 3 Encounters:  04/05/20 277 lb 6.4 oz (125.8 kg)  03/06/20 281 lb 4 oz (127.6 kg)  03/04/20 270 lb (122.5 kg)     GEN: Well nourished, well developed in no acute distress.  Obese. HEENT: Normal NECK: No JVD; No carotid bruits LYMPHATICS: No lymphadenopathy CARDIAC: RRR, no murmurs, rubs, gallops RESPIRATORY:  Clear to auscultation without rales, wheezing or rhonchi  ABDOMEN: Soft, non-tender, non-distended MUSCULOSKELETAL:  No edema; No deformity  SKIN: Warm and dry NEUROLOGIC:   Alert and oriented x 3 PSYCHIATRIC:  Normal affect   ASSESSMENT:    1. Tachycardia   2. Encounter for monitoring flecainide therapy    PLAN:    In order of problems listed above:  1. Tachycardia Patient has episodes of narrow complex tachycardia with intermittent aberrant conduction.  He does not have ventricular tachycardia.  Patient has had multiple episodes of the tachycardia observed on ZIO and EKGs.  The P wave axis changes with the onset of the tachycardia which is indicative of an atrial tachycardia.  I am unsure exactly where the ectopic atrial focus is given sometimes the P  wave overlies the previous T wave.  I would like to start with flecainide 50 mg twice daily.  I will plan on changing his propranolol to Toprol-XL 25 mg twice daily.  We will plan on him doing an exercise tolerance test in 1 to 2 weeks while on the metoprolol and flecainide to confirm he electrically tolerates the flecainide.  I will plan on seeing him back in 3 to 4 weeks.  If he does not tolerate flecainide therapy or if it is ineffective, we will plan on EP study and possible ablation.  2.  Obesity Patient is working on weight loss.    Medication Adjustments/Labs and Tests Ordered: Current medicines are reviewed at length with the patient today.  Concerns regarding medicines are outlined above.  Orders Placed This Encounter  Procedures  . Exercise Tolerance Test  . EKG 12-Lead   Meds ordered this encounter  Medications  . metoprolol succinate (TOPROL XL) 25 MG 24 hr tablet    Sig: Take 1 tablet (25 mg total) by mouth in the morning and at bedtime.    Dispense:  180 tablet    Refill:  3  . flecainide (TAMBOCOR) 50 MG tablet    Sig: Take 1 tablet (50 mg total) by mouth 2 (two) times daily.    Dispense:  180 tablet    Refill:  3     Signed, Steffanie Dunn, MD, [facility]  04/05/2020 4:32 PM    Electrophysiology [facility]

## 2020-04-05 NOTE — Telephone Encounter (Signed)
2nd outreach attempt made by scheduler.  Unable to contact Pt.  If Pt would like sooner appt with Dr. Lalla Brothers there is availability at the [facility] office.  Please contact [place] as needed.

## 2020-04-05 NOTE — Telephone Encounter (Signed)
Routing to Dr Lalla Brothers and his nurse for any recommendations. Patient is scheduled to see Dr Lalla Brothers on 04/17/20.

## 2020-04-05 NOTE — Patient Instructions (Addendum)
Medication Instructions:  Your physician has recommended you make the following change in your medication:   1.  STOP propranolol  2.  START metoprolol succinate (Toprol XL) 25 mg- Take one tablet by mouth TWICE a day  3.  START taking flecainide 50 mg- Take one tablet by mouth twice a day.  Labwork: None ordered.  Testing/Procedures: Your physician has requested that you have an exercise tolerance test.   Please schedule for exercise treadmill stress test in 2 weeks.  Follow-Up: Your physician wants you to follow-up in: 3-4 weeks with Dr. Lalla Brothers.     Any Other Special Instructions Will Be Listed Below (If Applicable).  If you need a refill on your cardiac medications before your next appointment, please call your pharmacy.   Flecainide tablets What is this medicine? FLECAINIDE (FLEK a nide) is an antiarrhythmic drug. This medicine is used to prevent irregular heart rhythm. It can also slow down fast heartbeats called tachycardia. This medicine may be used for other purposes; ask your health care provider or pharmacist if you have questions. COMMON BRAND NAME(S): Tambocor What should I tell my health care provider before I take this medicine? They need to know if you have any of these conditions:  abnormal levels of potassium in the blood  heart disease including heart rhythm and heart rate problems  kidney or liver disease  recent heart attack  an unusual or allergic reaction to flecainide, local anesthetics, other medicines, foods, dyes, or preservatives  pregnant or trying to get pregnant  breast-feeding How should I use this medicine? Take this medicine by mouth with a glass of water. Follow the directions on the prescription label. You can take this medicine with or without food. Take your doses at regular intervals. Do not take your medicine more often than directed. Do not stop taking this medicine suddenly. This may cause serious, heart-related side effects. If  your doctor wants you to stop the medicine, the dose may be slowly lowered over time to avoid any side effects. Talk to your pediatrician regarding the use of this medicine in children. While this drug may be prescribed for children as young as 1 year of age for selected conditions, precautions do apply. Overdosage: If you think you have taken too much of this medicine contact a poison control center or emergency room at once. NOTE: This medicine is only for you. Do not share this medicine with others. What if I miss a dose? If you miss a dose, take it as soon as you can. If it is almost time for your next dose, take only that dose. Do not take double or extra doses. What may interact with this medicine? Do not take this medicine with any of the following medications:  amoxapine  arsenic trioxide  certain antibiotics like clarithromycin, erythromycin, gatifloxacin, gemifloxacin, levofloxacin, moxifloxacin, sparfloxacin, or troleandomycin  certain antidepressants called tricyclic antidepressants like amitriptyline, imipramine, or nortriptyline  certain medicines to control heart rhythm like disopyramide, encainide, moricizine, procainamide, propafenone, and quinidine  cisapride  delavirdine  droperidol  haloperidol  hawthorn  imatinib  levomethadyl  maprotiline  medicines for malaria like chloroquine and halofantrine  pentamidine  phenothiazines like chlorpromazine, mesoridazine, prochlorperazine, thioridazine  pimozide  quinine  ranolazine  ritonavir  sertindole This medicine may also interact with the following medications:  cimetidine  dofetilide  medicines for angina or high blood pressure  medicines to control heart rhythm like amiodarone and digoxin  ziprasidone This list may not describe all possible interactions. Give  your health care provider a list of all the medicines, herbs, non-prescription drugs, or dietary supplements you use. Also tell them  if you smoke, drink alcohol, or use illegal drugs. Some items may interact with your medicine. What should I watch for while using this medicine? Visit your doctor or health care professional for regular checks on your progress. Because your condition and the use of this medicine carries some risk, it is a good idea to carry an identification card, necklace or bracelet with details of your condition, medications and doctor or health care professional. Check your blood pressure and pulse rate regularly. Ask your health care professional what your blood pressure and pulse rate should be, and when you should contact him or her. Your doctor or health care professional also may schedule regular blood tests and electrocardiograms to check your progress. You may get drowsy or dizzy. Do not drive, use machinery, or do anything that needs mental alertness until you know how this medicine affects you. Do not stand or sit up quickly, especially if you are an older patient. This reduces the risk of dizzy or fainting spells. Alcohol can make you more dizzy, increase flushing and rapid heartbeats. Avoid alcoholic drinks. What side effects may I notice from receiving this medicine? Side effects that you should report to your doctor or health care professional as soon as possible:  chest pain, continued irregular heartbeats  difficulty breathing  swelling of the legs or feet  trembling, shaking  unusually weak or tired Side effects that usually do not require medical attention (report to your doctor or health care professional if they continue or are bothersome):  blurred vision  constipation  headache  nausea, vomiting  stomach pain This list may not describe all possible side effects. Call your doctor for medical advice about side effects. You may report side effects to FDA at 1-800-FDA-1088. Where should I keep my medicine? Keep out of the reach of children. Store at room temperature between 15 and  30 degrees C (59 and 86 degrees F). Protect from light. Keep container tightly closed. Throw away any unused medicine after the expiration date. NOTE: This sheet is a summary. It may not cover all possible information. If you have questions about this medicine, talk to your doctor, pharmacist, or health care provider.  2020 Elsevier/Gold Standard (2018-04-12 11:41:38)

## 2020-04-16 ENCOUNTER — Other Ambulatory Visit (HOSPITAL_COMMUNITY)
Admission: RE | Admit: 2020-04-16 | Discharge: 2020-04-16 | Disposition: A | Discharge status: Home / Self Care | Payer: 59 | Source: Ambulatory Visit | Attending: Cardiology | Admitting: Cardiology

## 2020-04-16 DIAGNOSIS — Z20822 Contact with and (suspected) exposure to covid-19: Secondary | ICD-10-CM | POA: Insufficient documentation

## 2020-04-16 DIAGNOSIS — Z01812 Encounter for preprocedural laboratory examination: Secondary | ICD-10-CM | POA: Diagnosis not present

## 2020-04-16 LAB — SARS CORONAVIRUS 2 (TAT 6-24 HRS): SARS Coronavirus 2: NEGATIVE

## 2020-04-17 ENCOUNTER — Institutional Professional Consult (permissible substitution): Payer: 59 | Admitting: Cardiology

## 2020-04-17 ENCOUNTER — Ambulatory Visit (HOSPITAL_COMMUNITY)
Admission: RE | Admit: 2020-04-17 | Discharge: 2020-04-17 | Disposition: A | Discharge status: Home / Self Care | Payer: 59 | Source: Ambulatory Visit | Attending: Cardiology | Admitting: Cardiology

## 2020-04-17 ENCOUNTER — Other Ambulatory Visit: Payer: Self-pay

## 2020-04-17 ENCOUNTER — Telehealth (HOSPITAL_COMMUNITY): Payer: Self-pay | Admitting: *Deleted

## 2020-04-17 DIAGNOSIS — Z79899 Other long term (current) drug therapy: Secondary | ICD-10-CM | POA: Insufficient documentation

## 2020-04-17 DIAGNOSIS — R000 Tachycardia, unspecified: Secondary | ICD-10-CM | POA: Diagnosis present

## 2020-04-17 DIAGNOSIS — Z5181 Encounter for therapeutic drug level monitoring: Secondary | ICD-10-CM | POA: Insufficient documentation

## 2020-04-17 LAB — EXERCISE TOLERANCE TEST
Estimated workload: 11 METS
Exercise duration (min): 9 min
Exercise duration (sec): 37 s
MPHR: 178 {beats}/min
Peak HR: 164 {beats}/min
Percent HR: 92 %
Rest HR: 59 {beats}/min

## 2020-04-17 NOTE — Telephone Encounter (Signed)
Close encounter 

## 2020-04-22 ENCOUNTER — Encounter (HOSPITAL_BASED_OUTPATIENT_CLINIC_OR_DEPARTMENT_OTHER): Payer: 59 | Admitting: Cardiology

## 2020-05-06 ENCOUNTER — Ambulatory Visit (HOSPITAL_BASED_OUTPATIENT_CLINIC_OR_DEPARTMENT_OTHER): Payer: 59 | Attending: Cardiology | Admitting: Cardiology

## 2020-05-06 ENCOUNTER — Other Ambulatory Visit: Payer: Self-pay

## 2020-05-06 DIAGNOSIS — G4730 Sleep apnea, unspecified: Secondary | ICD-10-CM

## 2020-05-06 DIAGNOSIS — R0683 Snoring: Secondary | ICD-10-CM

## 2020-05-07 NOTE — Procedures (Signed)
   Patient Name: Jeremy Roberson, Bucklin Date: 05/06/2020 Gender: Male D.O.B: [birth year] Age (years): 73 Referring Provider: Armanda Magic MD, ABSM Height (inches): 72 Interpreting Physician: Armanda Magic MD, ABSM Weight (lbs): 270 RPSGT: Rosette Reveal BMI: 37 MRN: [id number] Neck Size: 18.00  CLINICAL INFORMATION Sleep Study Type: NPSG  Indication for sleep study: N/A  Epworth Sleepiness Score: 9  SLEEP STUDY TECHNIQUE As per the AASM Manual for the Scoring of Sleep and Associated Events v2.3 (April 2016) with a hypopnea requiring 4% desaturations.  The channels recorded and monitored were frontal, central and occipital EEG, electrooculogram (EOG), submentalis EMG (chin), nasal and oral airflow, thoracic and abdominal wall motion, anterior tibialis EMG, snore microphone, electrocardiogram, and pulse oximetry.  MEDICATIONS Medications self-administered by patient taken the night of the study : MELATONIN  SLEEP ARCHITECTURE The study was initiated at 10:19:54 PM and ended at 4:45:01 AM.  Sleep onset time was 28.4 minutes and the sleep efficiency was 54.0%. The total sleep time was 208 minutes.  Stage REM latency was 105.0 minutes.  The patient spent 7.7% of the night in stage N1 sleep, 69.2% in stage N2 sleep, 0.0% in stage N3 and 23.1% in REM.  Alpha intrusion was absent.  Supine sleep was 5.29%.  RESPIRATORY PARAMETERS The overall apnea/hypopnea index (AHI) was 4.0 per hour. There were 1 total apneas, including 0 obstructive, 1 central and 0 mixed apneas. There were 13 hypopneas and 10 RERAs.  The AHI during Stage REM sleep was 2.5 per hour.  AHI while supine was 5.5 per hour.  The mean oxygen saturation was 94.1%. The minimum SpO2 during sleep was 89.0%.  soft snoring was noted during this study.  CARDIAC DATA The 2 lead EKG demonstrated sinus rhythm. The mean heart rate was 46.4 beats per minute. Other EKG findings include: None.  LEG MOVEMENT DATA The total  PLMS were 0 with a resulting PLMS index of 0.0. Associated arousal with leg movement index was 0.0 .  IMPRESSIONS - No significant obstructive sleep apnea occurred during this study (AHI = 4.0/h). - No significant central sleep apnea occurred during this study (CAI = 0.3/h). - The patient had minimal or no oxygen desaturation during the study (Min O2 = 89.0%) - The patient snored with soft snoring volume. - No cardiac abnormalities were noted during this study. - Clinically significant periodic limb movements did not occur during sleep. No significant associated arousals.  DIAGNOSIS - Normal Study  RECOMMENDATIONS - Avoid alcohol, sedatives and other CNS depressants that may worsen sleep apnea and disrupt normal sleep architecture. - Sleep hygiene should be reviewed to assess factors that may improve sleep quality. - Weight management and regular exercise should be initiated or continued if appropriate.  [Electronically signed] 05/07/2020 03:36 PM  Armanda Magic MD, ABSM Diplomate, American Board of Sleep Medicine

## 2020-05-09 ENCOUNTER — Ambulatory Visit: Payer: 59 | Admitting: Cardiology

## 2020-05-09 ENCOUNTER — Encounter: Payer: Self-pay | Admitting: Cardiology

## 2020-05-09 ENCOUNTER — Other Ambulatory Visit: Payer: Self-pay

## 2020-05-09 VITALS — BP 116/74 | HR 52 | Ht 72.0 in | Wt 280.0 lb

## 2020-05-09 DIAGNOSIS — R000 Tachycardia, unspecified: Secondary | ICD-10-CM | POA: Diagnosis not present

## 2020-05-09 DIAGNOSIS — I471 Supraventricular tachycardia: Secondary | ICD-10-CM

## 2020-05-09 DIAGNOSIS — R0683 Snoring: Secondary | ICD-10-CM

## 2020-05-09 NOTE — Progress Notes (Signed)
Electrophysiology Office Follow up Visit Note:    Date:  05/09/2020   ID:  Jeremy Roberson, DOB 05-Nov-1975, MRN [id number]  PCP:  Blair Heys, MD  [facility] Cardiologist:  Julien Nordmann, MD  [facility] Electrophysiologist:  Lanier Prude, MD    Interval History:    Jeremy Roberson is a 45 y.o. male who presents for a follow up visit.  I last saw him in clinic on April 05, 2020.  At that appointment we started flecainide 50 mg by mouth twice daily in addition to Toprol-XL 25 mg by mouth twice daily in an effort to control his symptomatic atrial tachycardia.  He tells me that since starting the flecainide he has had no further episodes of atrial tachycardia and is feeling much better.  No off target effects from the flecainide and metoprolol.  He did recently complete his sleep study but is awaiting the results.  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  He is pleased with how he is feeling.  No syncope or presyncope.     Past Medical History:  Diagnosis Date  . Obesity   . Palpitations   . Paroxysmal SVT (supraventricular tachycardia) (HCC) 01/2020   (a) 2 episdoes by monitor 01/2020    Past Surgical History:  Procedure Laterality Date  . LEG SURGERY      Current Medications: Current Meds  Medication Sig  . aspirin EC 81 MG tablet Take 81 mg by mouth daily. Swallow whole.  . flecainide (TAMBOCOR) 50 MG tablet Take 1 tablet (50 mg total) by mouth 2 (two) times daily.  Marland Kitchen ibuprofen (ADVIL) 200 MG tablet Take by mouth as needed.  . loratadine (CLARITIN) 10 MG tablet Take by mouth daily as needed.  . melatonin 5 MG TABS Take 5 mg by mouth as needed.  . metoprolol succinate (TOPROL XL) 25 MG 24 hr tablet Take 1 tablet (25 mg total) by mouth in the morning and at bedtime.     Allergies:   Latex   Social History   Socioeconomic History  . Marital status: Single    Spouse name: Not on file  . Number of children: Not  on file  . Years of education: Not on file  . Highest education level: Not on file  Occupational History  . Not on file  Tobacco Use  . Smoking status: Never Smoker  . Smokeless tobacco: Never Used  Vaping Use  . Vaping Use: Never used  Substance and Sexual Activity  . Alcohol use: Yes  . Drug use: No  . Sexual activity: Not on file  Other Topics Concern  . Not on file  Social History Narrative  . Not on file   Social Determinants of Health   Financial Resource Strain: Not on file  Food Insecurity: Not on file  Transportation Needs: Not on file  Physical Activity: Not on file  Stress: Not on file  Social Connections: Not on file     Family History: The patient's family history includes Atrial fibrillation in his mother.  ROS:   Please see the history of present illness.    All other systems reviewed and are negative.  EKGs/Labs/Other Studies Reviewed:    The following studies were reviewed today:  April 17, 2020 exercise ECG personally reviewed No evidence of ischemic heart disease.  No adverse effects of flecainide on the heart electrical system during the stress test.  EKG:  The ekg ordered  today demonstrates sinus rhythm.  PR interval 196 ms.  QRS duration 100 ms.  No ectopy.  Recent Labs: 12/01/2019: TSH 1.802 03/04/2020: BUN 14; Creatinine, Ser 1.08; Hemoglobin 15.3; Magnesium 2.2; Platelets 234; Potassium 4.0; Sodium 140  Recent Lipid Panel No results found for: CHOL, TRIG, HDL, CHOLHDL, VLDL, LDLCALC, LDLDIRECT  Physical Exam:    VS:  BP 116/74   Pulse (!) 52   Ht 6' (1.829 m)   Wt 280 lb (127 kg)   SpO2 97%   BMI 37.97 kg/m     Wt Readings from Last 3 Encounters:  05/09/20 280 lb (127 kg)  05/06/20 270 lb (122.5 kg)  04/05/20 277 lb 6.4 oz (125.8 kg)     GEN:  Well nourished, well developed in no acute distress.  Obese HEENT: Normal NECK: No JVD; No carotid bruits LYMPHATICS: No lymphadenopathy CARDIAC: RRR, no murmurs, rubs,  gallops RESPIRATORY:  Clear to auscultation without rales, wheezing or rhonchi  ABDOMEN: Soft, non-tender, non-distended MUSCULOSKELETAL:  No edema; No deformity  SKIN: Warm and dry NEUROLOGIC:  Alert and oriented x 3 PSYCHIATRIC:  Normal affect   ASSESSMENT:    1. Paroxysmal SVT (supraventricular tachycardia) (HCC)   2. Tachycardia   3. Morbid obesity (HCC)   4. Snores    PLAN:    In order of problems listed above:  1. Paroxysmal SVT Likely atrial tachycardia Well-controlled on flecainide and metoprolol XL Plan to continue the above medications.  I will plan on seeing him back in 6 months to assess for recurrent symptoms.  I think if he has breakthrough episodes on flecainide, I would favor taking him to the lab for EP study and possible ablation.  We could also consider increasing the dose of flecainide to 100 mg twice daily.  2.  Snoring Patient is awaiting the results of her recent sleep study.  I suspect he is at a high risk a developing atrial fibrillation as he ages given the atrial ectopy.  He is working on addressing possible sleep apnea and losing weight which I think will help reduce his risk.  3.  Obesity Encouraged weight loss as above.  We discussed during today's visit getting back to exercising routinely.     Medication Adjustments/Labs and Tests Ordered: Current medicines are reviewed at length with the patient today.  Concerns regarding medicines are outlined above.  Orders Placed This Encounter  Procedures  . EKG 12-Lead   No orders of the defined types were placed in this encounter.    Signed, Steffanie Dunn, MD, [facility]  05/09/2020 9:40 PM    Electrophysiology [facility]

## 2020-05-09 NOTE — Patient Instructions (Addendum)
Medication Instructions:  Your physician recommends that you continue on your current medications as directed. Please refer to the Current Medication list given to you today.  Labwork: None ordered.  Testing/Procedures: None ordered.  Follow-Up: Your physician wants you to follow-up in: 6 months with Dr. Lambert.   You will receive a reminder letter in the mail two months in advance. If you don't receive a letter, please call our office to schedule the follow-up appointment.   Any Other Special Instructions Will Be Listed Below (If Applicable).  If you need a refill on your cardiac medications before your next appointment, please call your pharmacy.   

## 2020-05-15 ENCOUNTER — Telehealth: Payer: Self-pay | Admitting: *Deleted

## 2020-05-15 NOTE — Telephone Encounter (Signed)
Informed patient of sleep study results and patient understanding was verbalized. Patient understands his sleep study showed sleep study showed no significant sleep apnea.   Pt is aware and agreeable to normal results.

## 2020-05-15 NOTE — Telephone Encounter (Signed)
-----   Message from Quintella Reichert, MD sent at 05/07/2020  3:38 PM EST ----- Please let patient know that sleep study showed no significant sleep apnea.

## 2020-05-18 ENCOUNTER — Other Ambulatory Visit: Payer: Self-pay | Admitting: Family Medicine

## 2020-05-18 DIAGNOSIS — R3129 Other microscopic hematuria: Secondary | ICD-10-CM

## 2020-05-18 DIAGNOSIS — R109 Unspecified abdominal pain: Secondary | ICD-10-CM

## 2020-06-01 ENCOUNTER — Ambulatory Visit
Admission: RE | Admit: 2020-06-01 | Discharge: 2020-06-01 | Disposition: A | Discharge status: Home / Self Care | Payer: 59 | Source: Ambulatory Visit | Attending: Family Medicine | Admitting: Family Medicine

## 2020-06-01 ENCOUNTER — Other Ambulatory Visit: Payer: Self-pay

## 2020-06-01 DIAGNOSIS — R109 Unspecified abdominal pain: Secondary | ICD-10-CM

## 2020-06-01 DIAGNOSIS — R3129 Other microscopic hematuria: Secondary | ICD-10-CM

## 2020-06-01 MED ORDER — IOPAMIDOL (ISOVUE-300) INJECTION 61%
100.0000 mL | Freq: Once | INTRAVENOUS | Status: AC | PRN
  Administered 2020-06-01: 100 mL via INTRAVENOUS

## 2021-02-12 IMAGING — CR DG CHEST 2V
2 series · 2 of 2 positions shown · non-contrast
Comparison: None.

CLINICAL DATA: Cardiac palpitations

EXAM:
CHEST - 2 VIEW

[chest pa]
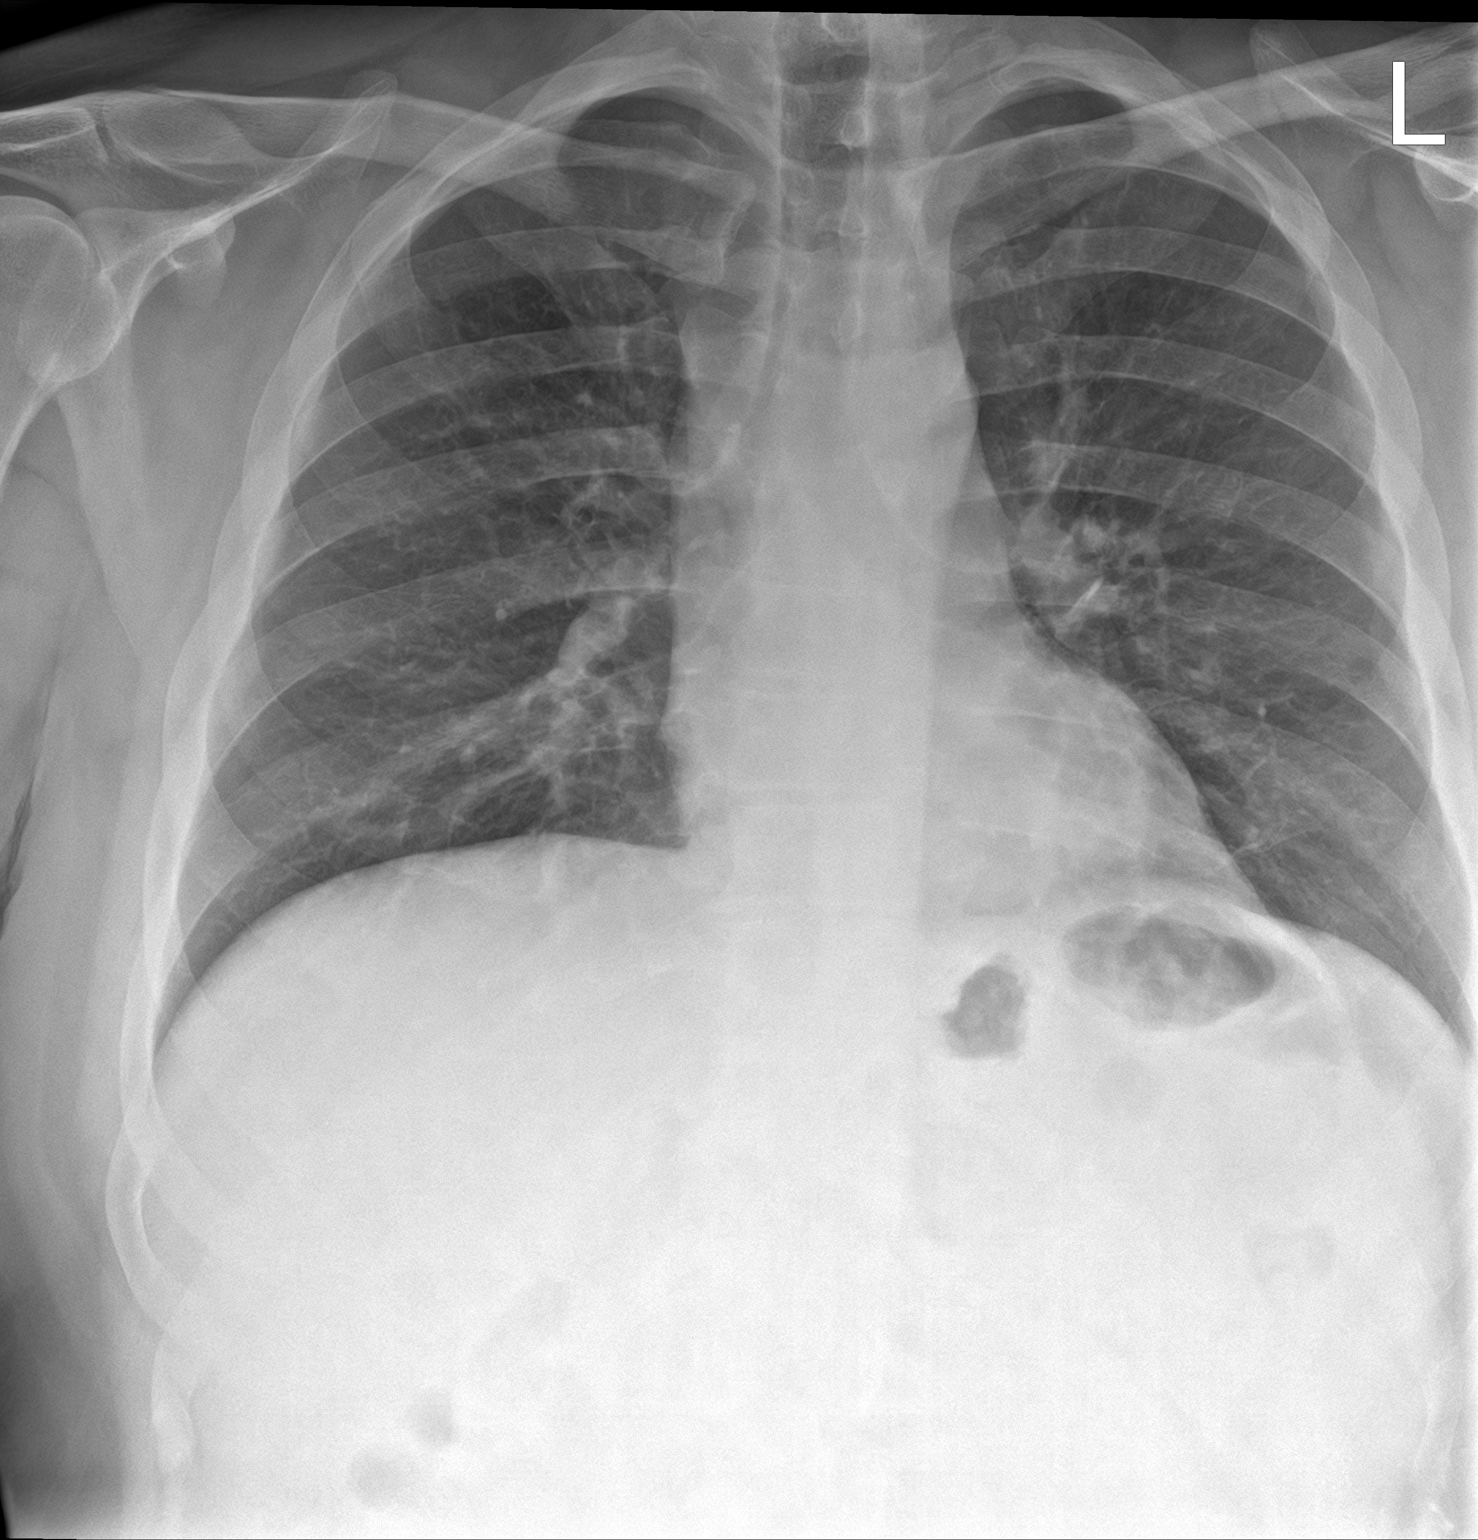

[chest lat]
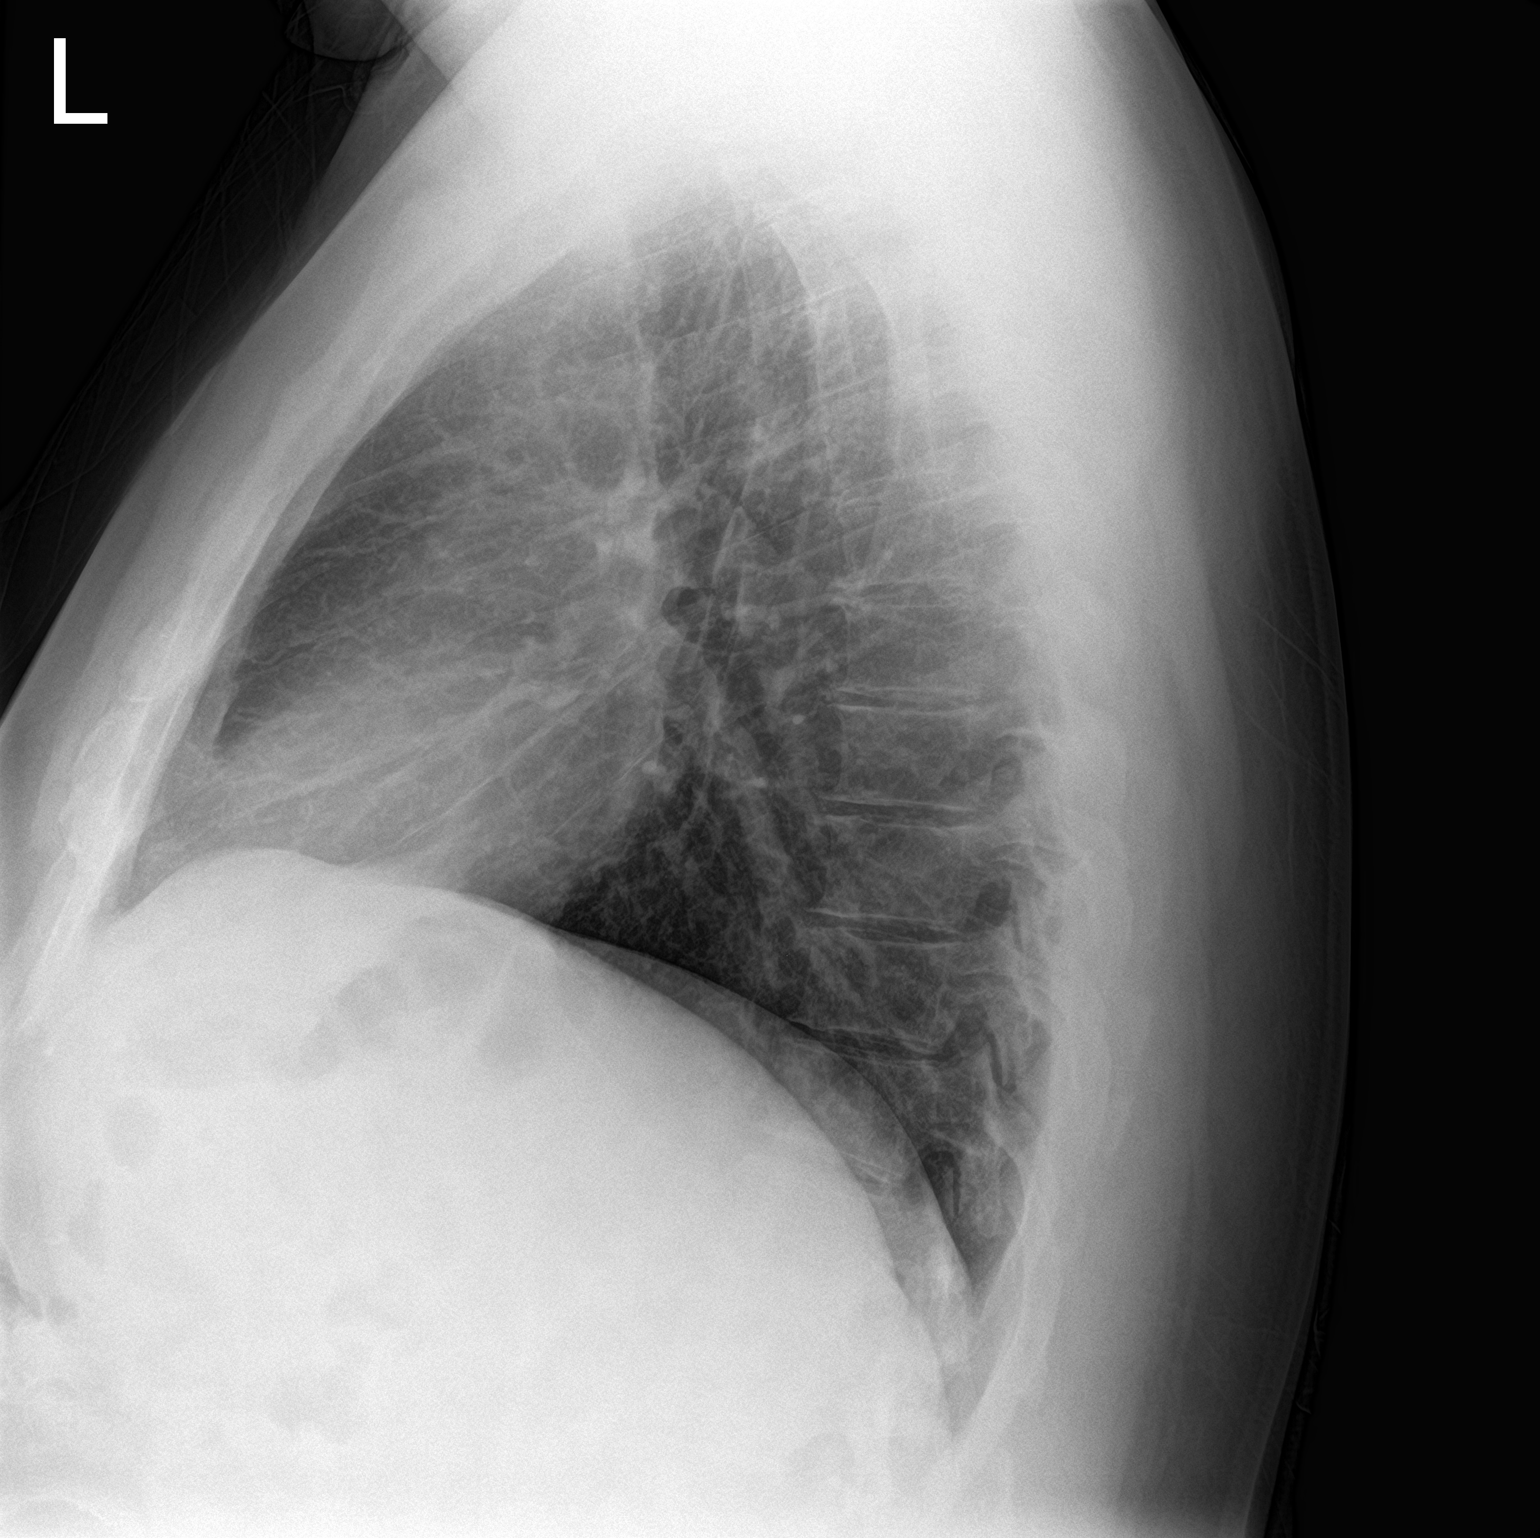

[2 of 2 positions shown; findings below may reference images not displayed]

FINDINGS: The heart size and mediastinal contours are within normal limits.
Both lungs are clear. The visualized skeletal structures are
unremarkable.
IMPRESSION: No active cardiopulmonary disease.

## 2021-03-16 NOTE — Progress Notes (Signed)
Cardiology Office Note Date:  03/16/2021  Patient ID:  Jeremy Roberson, Jeremy Roberson [birth year], MRN [id number] PCP:  Blair Heys, MD  Cardiologist:  Dr. Mariah Milling Electrophysiologist: Dr. Lalla Brothers    Chief Complaint:  6 mo, Flecainide visit  History of Present Illness: Jeremy Roberson is a 45 y.o. male with history of palpitations, tachycardia  He was referred to Dr. Lalla Brothers Dec 2021 for palpitations, tachycardiac.  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 Zio tracings reviewed by Dr. 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 Flecainide started  If AAD ineffective alternative strategy an ablation  F/u stress test looked OK, no evidence of ischemic or conduction system prolongation.  F/u with Dr. Lalla Brothers Jan 2022, was feeling better, working less night shifts, happy with his symptom suppression If recurrent/breaththrough symptoms favired ablation though could consider 100mg  BID of Flecainide.  TODAY He is doing well. 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  He is looking forward to it and very motivated to make some healthy lifestyle changes. He had once in the summer about 6hrs that his HR was stuck 130;s at rest, after a while makes him feel poorly, nauseous. This is the only episode of tachycardia he has had since starting the flecainide that he recalls He denies any CP, SOB, no near syncope or syncope Feels like he has good exertional capacity though with his weight has started to feel the aches/pains of being out of shape.  He was on an exercise bike last week and started going the the gym some in preparation for the program and felt good exercising and better even afterwards.      Past Medical History:  Diagnosis Date   Obesity    Palpitations    Paroxysmal SVT (supraventricular tachycardia) (HCC)  01/2020   (a) 2 episdoes by monitor 01/2020    Past Surgical History:  Procedure Laterality Date   LEG SURGERY      Current Outpatient Medications  Medication Sig Dispense Refill   aspirin EC 81 MG tablet Take 81 mg by mouth daily. Swallow whole.     flecainide (TAMBOCOR) 50 MG tablet Take 1 tablet (50 mg total) by mouth 2 (two) times daily. 180 tablet 3   ibuprofen (ADVIL) 200 MG tablet Take by mouth as needed.     loratadine (CLARITIN) 10 MG tablet Take by mouth daily as needed.     melatonin 5 MG TABS Take 5 mg by mouth as needed.     metoprolol succinate (TOPROL XL) 25 MG 24 hr tablet Take 1 tablet (25 mg total) by mouth in the morning and at bedtime. 180 tablet 3   No current facility-administered medications for this visit.    Allergies:   Latex   Social History:  The patient  reports that he has never smoked. He has never used smokeless tobacco. He reports current alcohol use. He reports that he does not use drugs.   Family History:  The patient's family history includes Atrial fibrillation in his mother.  ROS:  Please see the history of present illness.    All other systems are reviewed and otherwise negative.   PHYSICAL EXAM:  VS:  There were no vitals taken for this visit. BMI: There is no height or weight on file to calculate BMI. Well nourished, well developed, in  no acute distress HEENT: normocephalic, atraumatic Neck: no JVD, carotid bruits or masses Cardiac:  RRR; no significant murmurs, no rubs, or gallops Lungs:  CTA b/l, no wheezing, rhonchi or rales Abd: soft, nontender MS: no deformity or atrophy Ext: no edema Skin: warm and dry, no rash Neuro:  No gross deficits appreciated Psych: euthymic mood, full affect    EKG:  Done today and reviewed by myself shows  SB 55bpm, PR 196, QRS 96, Qtc 403 Stable intervals   12/142021: EST There was no ST segment deviation noted during stress.   Negative adequate stress test   03/07/2020: TTE IMPRESSIONS    1. Left ventricular ejection fraction, by estimation, is 60 to 65%. The  left ventricle has normal function. The left ventricle has no regional  wall motion abnormalities. Left ventricular diastolic parameters were  normal.   2. Right ventricular systolic function is normal. The right ventricular  size is normal.   3. The mitral valve is normal in structure. No evidence of mitral valve  regurgitation. No evidence of mitral stenosis.   4. The aortic valve is normal in structure. Aortic valve regurgitation is  not visualized. No aortic stenosis is present.    Recent Labs: No results found for requested labs within last 8760 hours.  No results found for requested labs within last 8760 hours.   CrCl cannot be calculated (Patient's most recent lab result is older than the maximum 21 days allowed.).   Wt Readings from Last 3 Encounters:  05/09/20 280 lb (127 kg)  05/06/20 270 lb (122.5 kg)  04/05/20 277 lb 6.4 oz (125.8 kg)     Other studies reviewed: Additional studies/records reviewed today include: summarized above  ASSESSMENT AND PLAN:  ATach Flecainide/metoprolol No changes  Disposition: F/u with [place] in 17mo, sooner if needed  Current medicines are reviewed at length with the patient today.  The patient did not have any concerns regarding medicines.  Norma Fredrickson, PA-C 03/16/2021 8:31 AM     [facility] [address] [place] [address] [phone] (office)  [phone] (fax)

## 2021-03-18 ENCOUNTER — Other Ambulatory Visit: Payer: Self-pay

## 2021-03-18 ENCOUNTER — Encounter: Payer: Self-pay | Admitting: Physician Assistant

## 2021-03-18 ENCOUNTER — Ambulatory Visit: Payer: 59 | Admitting: Physician Assistant

## 2021-03-18 VITALS — BP 108/76 | HR 55 | Ht 72.0 in | Wt 288.2 lb

## 2021-03-18 DIAGNOSIS — I471 Supraventricular tachycardia: Secondary | ICD-10-CM | POA: Diagnosis not present

## 2021-03-18 DIAGNOSIS — Z79899 Other long term (current) drug therapy: Secondary | ICD-10-CM

## 2021-03-18 DIAGNOSIS — Z5181 Encounter for therapeutic drug level monitoring: Secondary | ICD-10-CM | POA: Diagnosis not present

## 2021-03-18 MED ORDER — FLECAINIDE ACETATE 50 MG PO TABS
50.0000 mg | ORAL_TABLET | Freq: Two times a day (BID) | ORAL | 3 refills | Status: DC
Start: 2021-03-18 — End: 2022-01-08

## 2021-03-18 MED ORDER — METOPROLOL SUCCINATE ER 25 MG PO TB24
25.0000 mg | ORAL_TABLET | Freq: Two times a day (BID) | ORAL | 3 refills | Status: DC
Start: 2021-03-18 — End: 2022-01-08

## 2021-03-18 NOTE — Patient Instructions (Signed)
Medication Instructions:   Your physician recommends that you continue on your current medications as directed. Please refer to the Current Medication list given to you today.   *If you need a refill on your cardiac medications before your next appointment, please call your pharmacy*   Lab Work:  NONE ORDERED  TODAY   If you have labs (blood work) drawn today and your tests are completely normal, you will receive your results only by: MyChart Message (if you have MyChart) OR A paper copy in the mail If you have any lab test that is abnormal or we need to change your treatment, we will call you to review the results.   Testing/Procedures: NONE ORDERED  TODAY    Follow-Up: At [facility], you and your health needs are our priority.  As part of our continuing mission to provide you with exceptional heart care, we have created designated Provider Care Teams.  These Care Teams include your primary Cardiologist (physician) and Advanced Practice Providers (APPs -  Physician Assistants and Nurse Practitioners) who all work together to provide you with the care you need, when you need it.  We recommend signing up for the patient portal called "MyChart".  Sign up information is provided on this After Visit Summary.  MyChart is used to connect with patients for Virtual Visits (Telemedicine).  Patients are able to view lab/test results, encounter notes, upcoming appointments, etc.  Non-urgent messages can be sent to your provider as well.   To learn more about what you can do with MyChart, go to [email or web address].    Your next appointment:   6 month(s)  The format for your next appointment:   In Person  Provider:   You may see Lanier Prude, MD  or one of the following Advanced Practice Providers on your designated Care Team:   Francis Dowse, [place] Casimiro Needle "Mardelle Matte" Lanna Poche, PA-C{ Other Instructions

## 2021-08-14 IMAGING — CT CT ABD-PELV W/ CM
1 of 3 series · 14 of 32 positions shown, 19 images · IV contrast (APPLIED)
Comparison: None.

CLINICAL DATA: Recurrent left abdominal pain with microhematuria

EXAM:
CT ABDOMEN AND PELVIS WITH CONTRAST
TECHNIQUE: Multidetector CT imaging of the abdomen and pelvis was performed
using the standard protocol following bolus administration of
intravenous contrast.
CONTRAST:  100mL GEJSWV-U33 IOPAMIDOL (GEJSWV-U33) INJECTION 61%

[Series 2: abd/pelvis w/cm · axial · 0.84mm/px · z∈[-396,+74]mm · 14 of 108 slices shown, 19 images]
[im 7/108  soft-tissue]
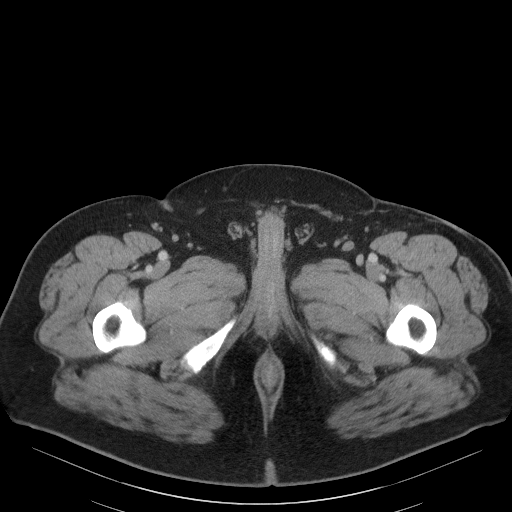
[im 7/108  bone]
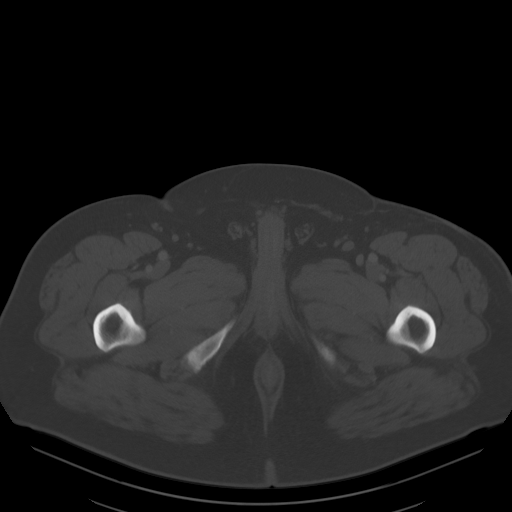
[im 14/108  soft-tissue]
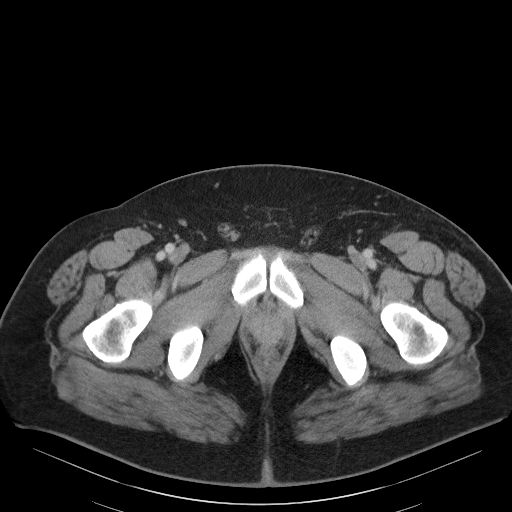
[im 21/108  soft-tissue]
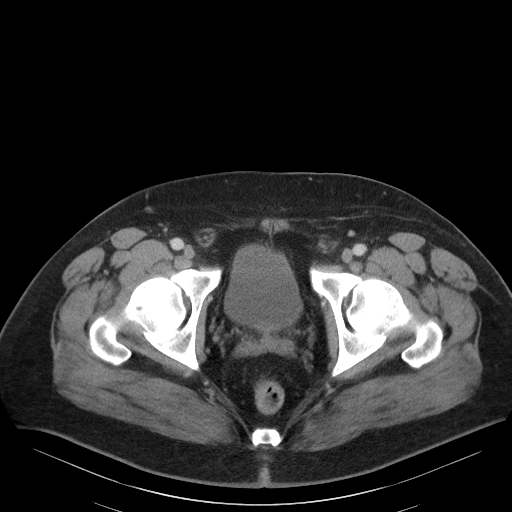
[im 34/108  soft-tissue]
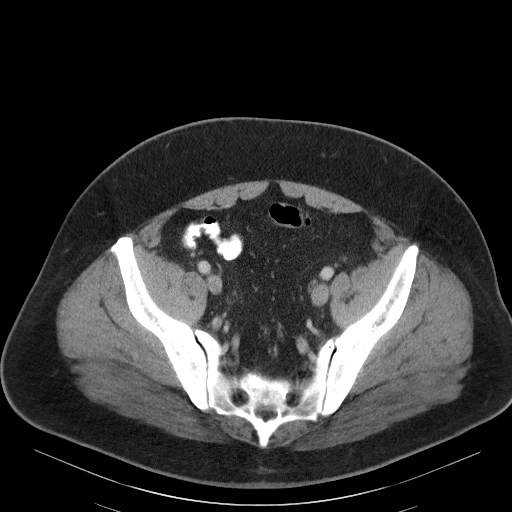
[im 41/108  soft-tissue]
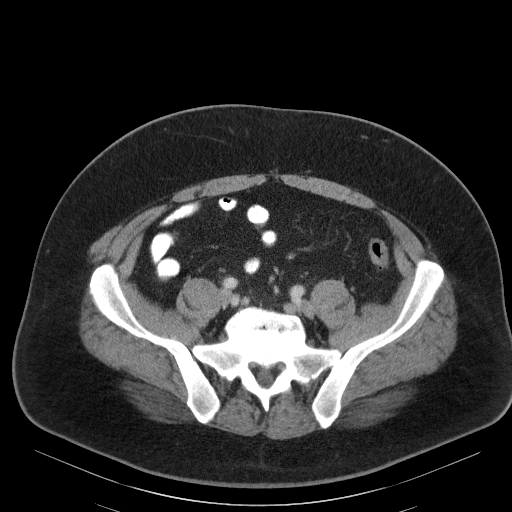
[im 47/108  soft-tissue]
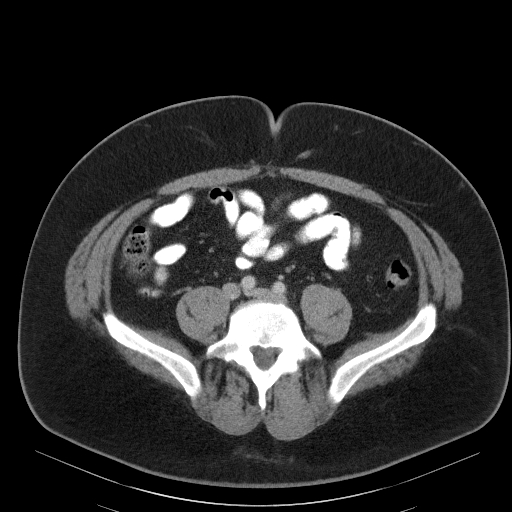
[im 54/108  soft-tissue]
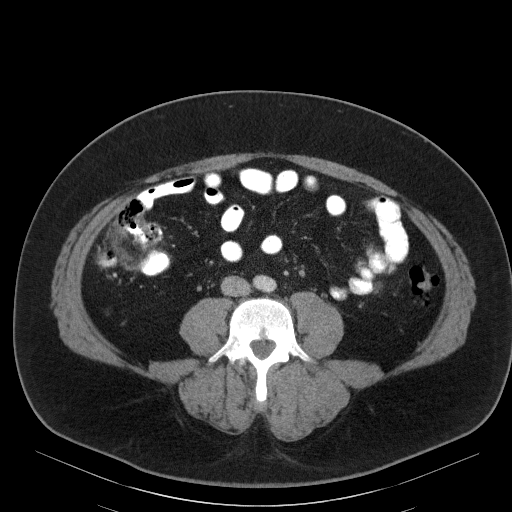
[im 61/108  soft-tissue]
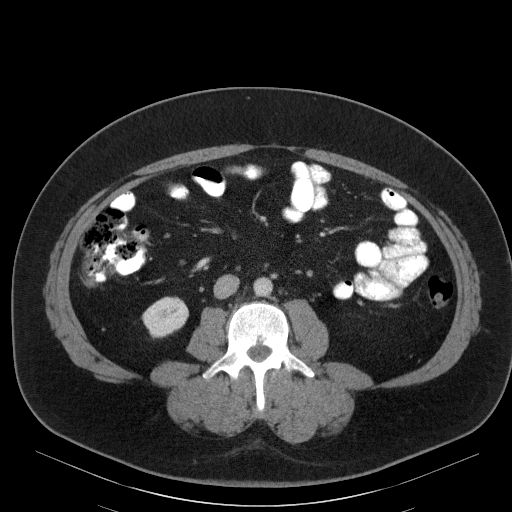
[im 67/108  soft-tissue]
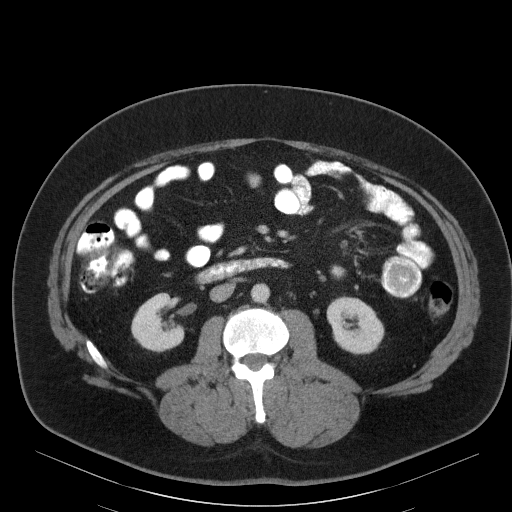
[im 67/108  bone]
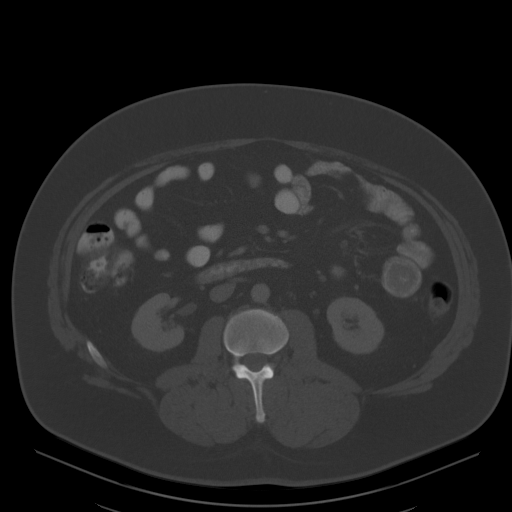
[im 74/108  soft-tissue]
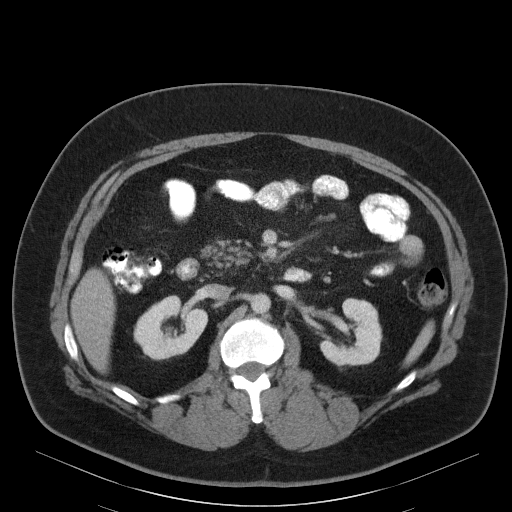
[im 81/108  lung]
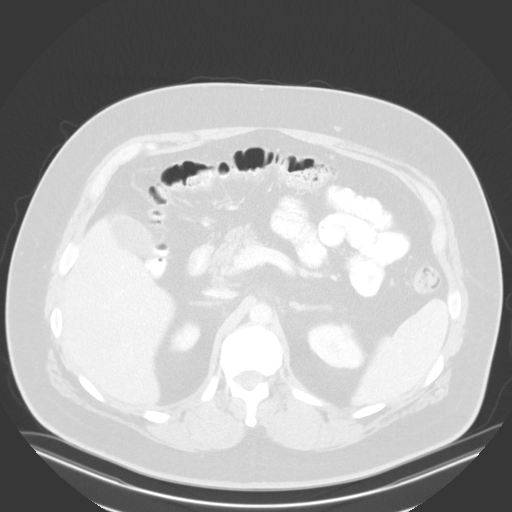
[im 87/108  soft-tissue]
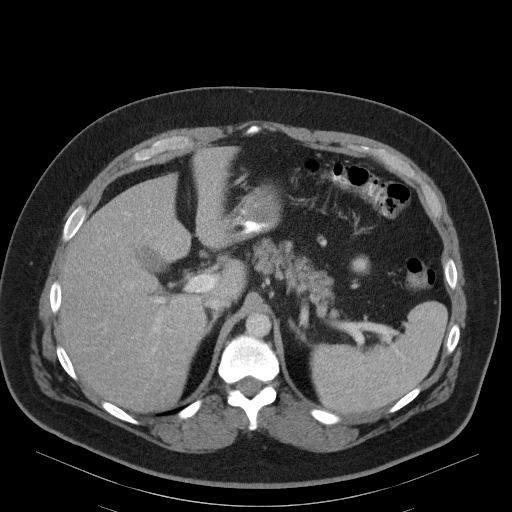
[im 87/108  lung]
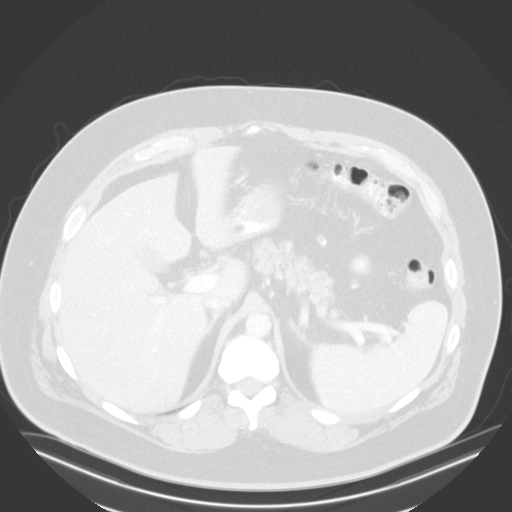
[im 94/108  soft-tissue]
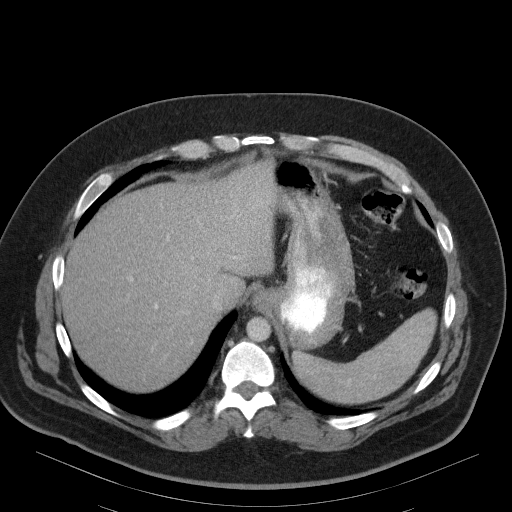
[im 94/108  lung]
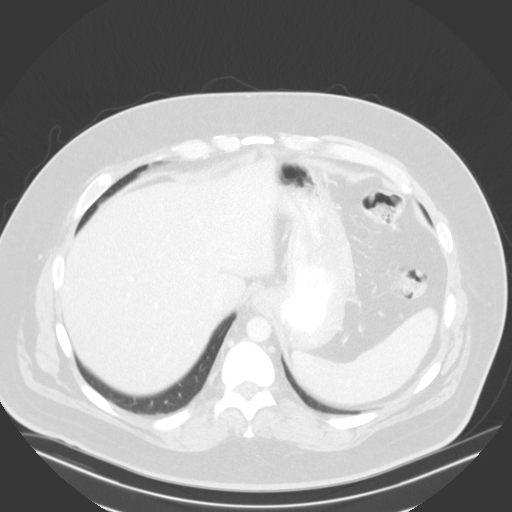
[im 101/108  soft-tissue]
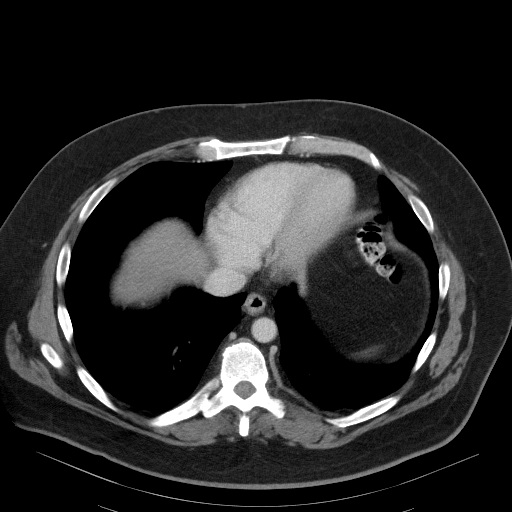
[im 101/108  lung]
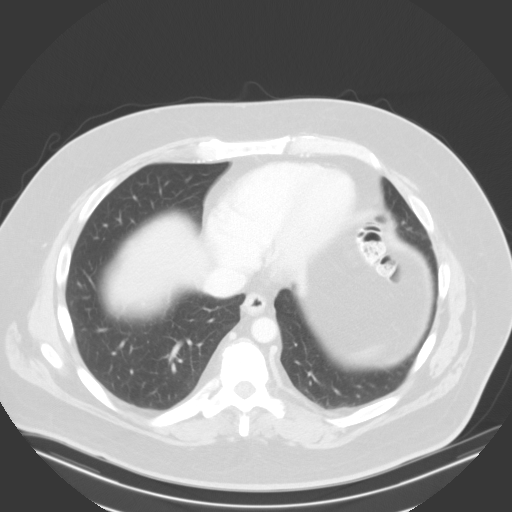

[14 of 32 positions shown; findings below may reference images not displayed]

FINDINGS: Lower chest: No acute abnormality.

Hepatobiliary: Subcentimeter hypodensity within the anterior dome of
the liver too small to further characterize. No calcified gallstone
or biliary dilatation

Pancreas: Unremarkable. No pancreatic ductal dilatation or
surrounding inflammatory changes.

Spleen: Normal in size without focal abnormality.

Adrenals/Urinary Tract: Adrenal glands are unremarkable. Kidneys are
normal, without renal calculi, focal lesion, or hydronephrosis.
Bladder is unremarkable.

Stomach/Bowel: Stomach is within normal limits. Appendix appears
normal. No evidence of bowel wall thickening, distention, or
inflammatory changes. Diverticular disease of the sigmoid colon
without acute wall thickening

Vascular/Lymphatic: No significant vascular findings are present. No
enlarged abdominal or pelvic lymph nodes.

Reproductive: Prostate is unremarkable.

Other: Negative for free air or free fluid.

Musculoskeletal: Degenerative changes at L5-S1.
IMPRESSION: 1. No CT evidence for acute intra-abdominal or pelvic abnormality.
2. Diverticular disease of the sigmoid colon without acute wall
thickening.

## 2022-01-07 NOTE — Progress Notes (Unsigned)
Cardiology Office Note Date:  01/07/2022  Patient ID:  Jeremy Roberson Jun 29, 1975, MRN [id number] PCP:  Blair Heys, MD  Cardiologist:  Dr. Mariah Milling Electrophysiologist: Dr. Lalla Brothers    Chief Complaint:  6 mo, Flecainide visit  History of Present Illness: Jeremy Roberson is a 46 y.o. male with history of palpitations, tachycardia  He was referred to Dr. Lalla Brothers Dec 2021 for palpitations, tachycardiac.  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 Zio tracings reviewed by Dr. 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 Flecainide started  If AAD ineffective alternative strategy an ablation  F/u stress test looked OK, no evidence of ischemic or conduction system prolongation.  F/u with Dr. Lalla Brothers Jan 2022, was feeling better, working less night shifts, happy with his symptom suppression If recurrent/breaththrough symptoms favired ablation though could consider 100mg  BID of Flecainide.  I saw him Nov 2022 He is doing well. 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  He is looking forward to it and very motivated to make some healthy lifestyle changes. He had once in the summer about 6hrs that his HR was stuck 130;s at rest, after a while makes him feel poorly, nauseous. This is the only episode of tachycardia he has had since starting the flecainide that he recalls He denies any CP, SOB, no near syncope or syncope Feels like he has good exertional capacity though with his weight has started to feel the aches/pains of being out of shape. He was on an exercise bike last week and started going the the gym some in preparation for the program and felt good exercising and better even afterwards. Stable EKG/intervals No changes were made.  TODAY He is doing really well No SVTs Remains very active especially at work No  CP, palpitations or cardiac awareness No concerns today  Just had his physical, had labs/lipids with his PMD   Past Medical History:  Diagnosis Date   Obesity    Palpitations    Paroxysmal SVT (supraventricular tachycardia) (HCC) 01/2020   (a) 2 episdoes by monitor 01/2020    Past Surgical History:  Procedure Laterality Date   LEG SURGERY      Current Outpatient Medications  Medication Sig Dispense Refill   aspirin EC 81 MG tablet Take 81 mg by mouth daily. Swallow whole.     flecainide (TAMBOCOR) 50 MG tablet Take 1 tablet (50 mg total) by mouth 2 (two) times daily. 180 tablet 3   ibuprofen (ADVIL) 200 MG tablet Take by mouth as needed.     loratadine (CLARITIN) 10 MG tablet Take by mouth daily as needed.     melatonin 5 MG TABS Take 5 mg by mouth as needed.     metoprolol succinate (TOPROL XL) 25 MG 24 hr tablet Take 1 tablet (25 mg total) by mouth in the morning and at bedtime. 180 tablet 3   No current facility-administered medications for this visit.    Allergies:   Latex   Social History:  The patient  reports that he has never smoked. He has never used smokeless tobacco. He reports current alcohol use. He reports that he does not use drugs.   Family History:  The patient's family history includes Atrial fibrillation in his mother.  ROS:  Please see the history of present illness.  All other systems are reviewed and otherwise negative.   PHYSICAL EXAM:  VS:  There were no vitals taken for this visit. BMI: There is no height or weight on file to calculate BMI. Well nourished, well developed, in no acute distress HEENT: normocephalic, atraumatic Neck: no JVD, carotid bruits or masses Cardiac:  RRR; no significant murmurs, no rubs, or gallops Lungs:  CTA b/l, no wheezing, rhonchi or rales Abd: soft, nontender MS: no deformity or atrophy Ext: no edema Skin: warm and dry, no rash Neuro:  No gross deficits appreciated Psych: euthymic mood, full affect    EKG:   Done today and reviewed by myself shows  SR 64bpm, PR 204, QRS 96, Qtc 422 SB 55bpm, PR 196, QRS 96, Qtc 403 Stable intervals   12/142021: EST There was no ST segment deviation noted during stress.   Negative adequate stress test   03/07/2020: TTE IMPRESSIONS   1. Left ventricular ejection fraction, by estimation, is 60 to 65%. The  left ventricle has normal function. The left ventricle has no regional  wall motion abnormalities. Left ventricular diastolic parameters were  normal.   2. Right ventricular systolic function is normal. The right ventricular  size is normal.   3. The mitral valve is normal in structure. No evidence of mitral valve  regurgitation. No evidence of mitral stenosis.   4. The aortic valve is normal in structure. Aortic valve regurgitation is  not visualized. No aortic stenosis is present.    Recent Labs: No results found for requested labs within last 365 days.  No results found for requested labs within last 365 days.   CrCl cannot be calculated (Patient's most recent lab result is older than the maximum 21 days allowed.).   Wt Readings from Last 3 Encounters:  03/18/21 288 lb 3.2 oz (130.7 kg)  05/09/20 280 lb (127 kg)  05/06/20 270 lb (122.5 kg)     Other studies reviewed: Additional studies/records reviewed today include: summarized above  ASSESSMENT AND PLAN:  ATach Flecainide/metoprolol No symptoms of his tachycardia No changes  Disposition: 13mo, sooner if needed  Current medicines are reviewed at length with the patient today.  The patient did not have any concerns regarding medicines.  Norma Fredrickson, PA-C 01/07/2022 5:40 PM     [facility] [address] [place] [address] [phone] (office)  [phone] (fax)

## 2022-01-08 ENCOUNTER — Ambulatory Visit: Payer: 59 | Attending: Physician Assistant | Admitting: Physician Assistant

## 2022-01-08 ENCOUNTER — Encounter: Payer: Self-pay | Admitting: Physician Assistant

## 2022-01-08 VITALS — BP 98/66 | HR 68 | Ht 72.0 in | Wt 287.6 lb

## 2022-01-08 DIAGNOSIS — Z5181 Encounter for therapeutic drug level monitoring: Secondary | ICD-10-CM | POA: Diagnosis not present

## 2022-01-08 DIAGNOSIS — I471 Supraventricular tachycardia: Secondary | ICD-10-CM | POA: Diagnosis not present

## 2022-01-08 DIAGNOSIS — Z79899 Other long term (current) drug therapy: Secondary | ICD-10-CM | POA: Diagnosis not present

## 2022-01-08 MED ORDER — FLECAINIDE ACETATE 50 MG PO TABS: 50.0000 mg | ORAL_TABLET | Freq: Two times a day (BID) | ORAL | 3 refills | Status: DC

## 2022-01-08 MED ORDER — METOPROLOL SUCCINATE ER 25 MG PO TB24: 25.0000 mg | ORAL_TABLET | Freq: Two times a day (BID) | ORAL | 3 refills | Status: DC

## 2022-01-08 NOTE — Patient Instructions (Signed)
Medication Instructions:   Your physician recommends that you continue on your current medications as directed. Please refer to the Current Medication list given to you today.  *If you need a refill on your cardiac medications before your next appointment, please call your pharmacy*   Lab Work:  NONE ORDERED  TODAY   If you have labs (blood work) drawn today and your tests are completely normal, you will receive your results only by: MyChart Message (if you have MyChart) OR A paper copy in the mail If you have any lab test that is abnormal or we need to change your treatment, we will call you to review the results.   Testing/Procedures: NONE ORDERED  TODAY     Follow-Up: At [facility], you and your health needs are our priority.  As part of our continuing mission to provide you with exceptional heart care, we have created designated Provider Care Teams.  These Care Teams include your primary Cardiologist (physician) and Advanced Practice Providers (APPs -  Physician Assistants and Nurse Practitioners) who all work together to provide you with the care you need, when you need it.  We recommend signing up for the patient portal called "MyChart".  Sign up information is provided on this After Visit Summary.  MyChart is used to connect with patients for Virtual Visits (Telemedicine).  Patients are able to view lab/test results, encounter notes, upcoming appointments, etc.  Non-urgent messages can be sent to your provider as well.   To learn more about what you can do with MyChart, go to [email or web address].    Your next appointment:   6 month(s)  The format for your next appointment:   In Person  Provider:   You may see Lanier Prude, MD or one of the following Advanced Practice Providers on your designated Care Team:   Francis Dowse, [place]  Other Instructions   Important Information About Sugar

## 2022-01-31 ENCOUNTER — Other Ambulatory Visit: Payer: Self-pay | Admitting: Physician Assistant

## 2022-10-30 ENCOUNTER — Other Ambulatory Visit: Payer: Self-pay

## 2022-10-30 ENCOUNTER — Emergency Department (HOSPITAL_COMMUNITY): Payer: 59

## 2022-10-30 ENCOUNTER — Emergency Department (HOSPITAL_COMMUNITY)
Admission: EM | Admit: 2022-10-30 | Discharge: 2022-10-30 | Disposition: A | Discharge status: Home / Self Care | Payer: 59 | Attending: Emergency Medicine | Admitting: Emergency Medicine

## 2022-10-30 DIAGNOSIS — Z91040 Latex allergy status: Secondary | ICD-10-CM | POA: Insufficient documentation

## 2022-10-30 DIAGNOSIS — M25462 Effusion, left knee: Secondary | ICD-10-CM | POA: Diagnosis not present

## 2022-10-30 DIAGNOSIS — M25562 Pain in left knee: Secondary | ICD-10-CM | POA: Diagnosis present

## 2022-10-30 DIAGNOSIS — Z79899 Other long term (current) drug therapy: Secondary | ICD-10-CM | POA: Insufficient documentation

## 2022-10-30 LAB — BODY FLUID CULTURE W GRAM STAIN

## 2022-10-30 LAB — SYNOVIAL CELL COUNT + DIFF, W/ CRYSTALS
Monocyte-Macrophage-Synovial Fluid: 12 % — ABNORMAL LOW (ref 50–90)
Neutrophil, Synovial: 88 % — ABNORMAL HIGH (ref 0–25)
WBC, Synovial: 32900 /mm3 — ABNORMAL HIGH (ref 0–200)

## 2022-10-30 MED ORDER — OXYCODONE-ACETAMINOPHEN 5-325 MG PO TABS
1.0000 | ORAL_TABLET | Freq: Once | ORAL | Status: AC
  Administered 2022-10-30: 1 via ORAL
  Filled 2022-10-30: qty 1

## 2022-10-30 MED ORDER — INDOMETHACIN 50 MG PO CAPS: 50.0000 mg | ORAL_CAPSULE | Freq: Two times a day (BID) | ORAL | 0 refills | Status: DC

## 2022-10-30 MED ORDER — OXYCODONE-ACETAMINOPHEN 5-325 MG PO TABS: 1.0000 | ORAL_TABLET | Freq: Four times a day (QID) | ORAL | 0 refills | Status: DC | PRN

## 2022-10-30 MED ORDER — KETOROLAC TROMETHAMINE 30 MG/ML IJ SOLN
30.0000 mg | Freq: Once | INTRAMUSCULAR | Status: AC
  Administered 2022-10-30: 30 mg via INTRAMUSCULAR
  Filled 2022-10-30: qty 1

## 2022-10-30 MED ORDER — LIDOCAINE-EPINEPHRINE (PF) 2 %-1:200000 IJ SOLN
20.0000 mL | Freq: Once | INTRAMUSCULAR | Status: AC
  Administered 2022-10-30: 20 mL
  Filled 2022-10-30: qty 20

## 2022-10-30 NOTE — ED Provider Notes (Addendum)
[place] EMERGENCY DEPARTMENT AT [facility] Provider Note   CSN: [id number] Arrival date & time: 10/30/22  0235     History  Chief Complaint  Patient presents with   Knee Pain    Jeremy Roberson is a 47 y.o. male.  HPI     This is a 47 year old male who presents with concerns for left knee pain.  Patient reports he began to have pain in his left foot and toe.  He has had this in the past and states that he felt like he probably had gout.  Denies any excessive drinking or pork products.  States that the pain has migrated from his left foot into his left knee.  He has never had knee pain like this before.  He reports significant pain with ambulation and bending of his left knee.  He has not had any fevers.  He did take ibuprofen which seemed to help yesterday but tonight pain worsened.  Denies trauma or injury.  Home Medications Prior to Admission medications   Medication Sig Start Date End Date Taking? Authorizing Provider  flecainide (TAMBOCOR) 50 MG tablet TAKE 1 TABLET BY MOUTH TWICE A DAY 01/31/22  Yes Sheilah Pigeon, PA-C  ibuprofen (ADVIL) 200 MG tablet Take by mouth as needed.   Yes [provider]  metoprolol succinate (TOPROL-XL) 25 MG 24 hr tablet TAKE 1 TABLET (25 MG TOTAL) BY MOUTH IN THE MORNING AND AT BEDTIME. 01/31/22  Yes Sheilah Pigeon, PA-C  indomethacin (INDOCIN) 50 MG capsule Take 1 capsule (50 mg total) by mouth 2 (two) times daily with a meal. 10/30/22   Marlana Mckowen, Mayer Masker, MD  oxyCODONE-acetaminophen (PERCOCET/ROXICET) 5-325 MG tablet Take 1 tablet by mouth every 6 (six) hours as needed for severe pain. 10/30/22   Codi Folkerts, Mayer Masker, MD      Allergies    Latex    Review of Systems   Review of Systems  Constitutional:  Negative for fever.  Musculoskeletal:        Knee pain  All other systems reviewed and are negative.   Physical Exam Updated Vital Signs BP 123/70 (BP Location: Left Arm)   Pulse 70   Temp 98.9 F (37.2 C)  (Oral)   Resp 15   SpO2 98%  Physical Exam Vitals and nursing note reviewed.  Constitutional:      Appearance: He is well-developed. He is obese. He is not ill-appearing.  HENT:     Head: Normocephalic and atraumatic.  Eyes:     Pupils: Pupils are equal, round, and reactive to light.  Cardiovascular:     Rate and Rhythm: Normal rate and regular rhythm.     Heart sounds: No murmur heard. Pulmonary:     Effort: Pulmonary effort is normal. No respiratory distress.  Musculoskeletal:     Cervical back: Neck supple.     Comments: Focused examination of the left knee with obvious effusion, pain with range of motion, no overlying skin changes or erythema No erythema or swelling noted of the bilateral feet, 2+ DP pulse, neurovascular intact  Lymphadenopathy:     Cervical: No cervical adenopathy.  Skin:    General: Skin is warm and dry.  Neurological:     Mental Status: He is alert and oriented to person, place, and time.  Psychiatric:        Mood and Affect: Mood normal.     ED Results / Procedures / Treatments   Labs (all labs ordered are listed, but  only abnormal results are displayed) Labs Reviewed  SYNOVIAL CELL COUNT + DIFF, W/ CRYSTALS - Abnormal; Notable for the following components:      Result Value   Color, Synovial YELLOW (*)    Appearance-Synovial CLOUDY (*)    WBC, Synovial 32,900 (*)    All other components within normal limits  BODY FLUID CULTURE W GRAM STAIN  GLUCOSE, BODY FLUID OTHER            PROTEIN, BODY FLUID (OTHER)    EKG None  Radiology DG Knee 2 Views Left  Result Date: 10/30/2022 CLINICAL DATA:  Left knee swelling, pain EXAM: LEFT KNEE - 1-2 VIEW COMPARISON:  None Available. FINDINGS: Normal alignment. No acute fracture or dislocation. Moderate left knee effusion is present. Soft tissues are otherwise unremarkable. IMPRESSION: 1. Moderate left knee effusion. Electronically Signed   By: Helyn Numbers M.D.   On: 10/30/2022 03:57     Procedures .Joint Aspiration/Arthrocentesis  Date/Time: 10/30/2022 4:40 AM  Performed by: Shon Baton, MD Authorized by: Shon Baton, MD   Consent:    Consent obtained:  Written   Consent given by:  Patient   Risks, benefits, and alternatives were discussed: yes     Risks discussed:  Bleeding, infection, pain and incomplete drainage   Alternatives discussed:  No treatment Universal protocol:    Immediately prior to procedure, a time out was called: yes     Patient identity confirmed:  Verbally with patient Location:    Location:  Knee   Knee:  L knee Anesthesia:    Anesthesia method:  Local infiltration   Local anesthetic:  Lidocaine 2% WITH epi Procedure details:    Preparation: Patient was prepped and draped in usual sterile fashion     Needle gauge: 21 G.   Approach:  Anterior   Aspirate amount:  30   Aspirate characteristics:  Yellow   Steroid injected: no     Specimen collected: yes   Post-procedure details:    Dressing:  Adhesive bandage   Procedure completion:  Tolerated     Medications Ordered in ED Medications  ketorolac (TORADOL) 30 MG/ML injection 30 mg (30 mg Intramuscular Given 10/30/22 0421)  oxyCODONE-acetaminophen (PERCOCET/ROXICET) 5-325 MG per tablet 1 tablet (1 tablet Oral Given 10/30/22 0421)  lidocaine-EPINEPHrine (XYLOCAINE W/EPI) 2 %-1:200000 (PF) injection 20 mL (20 mLs Infiltration Given by Other 10/30/22 0422)    ED Course/ Medical Decision Making/ A&P                             Medical Decision Making Amount and/or Complexity of Data Reviewed Labs: ordered. Radiology: ordered.  Risk Prescription drug management.   This patient presents to the ED for concern of left knee pain, this involves an extensive number of treatment options, and is a complaint that carries with it a high risk of complications and morbidity.  I considered the following differential and admission for this acute, potentially life threatening  condition.  The differential diagnosis includes gouty arthritis, inflammatory arthritis, septic arthritis  MDM:    This is a 47 year old male who presents with left knee pain.  Works as a Radiation protection practitioner.  History and physical exam most suggestive of inflammatory arthritis such as gout.  He does have an obvious effusion.  Bedside ultrasound shows drainable fluid.  We discussed risk and benefits of arthrocentesis to establish diagnosis and potentially help with pain with drainage of fluid.  Patient is amenable to  arthrocentesis.  This was done without any complications immediately.  Fluid was yellow.  Patient was given Toradol and Percocet for pain.  Will be presumptively treated for gout while results of arthrocentesis are pending.  Discussed with patient that I would call him as I do not have high suspicion for septic joint.  He is agreeable to this plan.  6:10 AM Cell count and differential reviewed.  Preliminary results with 32,900 white cells and monosodium urate crystals present.  This is consistent with inflammatory arthritis and gout.  Patient was called and updated.  (Labs, imaging, consults)  Labs: I Ordered, and personally interpreted labs.  The pertinent results include: Arthrocentesis studies pending  Imaging Studies ordered: I ordered imaging studies including x-ray with effusion I independently visualized and interpreted imaging. I agree with the radiologist interpretation  Additional history obtained from chart review.  External records from outside source obtained and reviewed including prior evaluations  Cardiac Monitoring: The patient was not maintained on a cardiac monitor.  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, works  EMS  Disposition: Discharge  Co morbidities that complicate the patient evaluation  Past Medical History:  Diagnosis Date   Obesity    Palpitations    Paroxysmal SVT (supraventricular tachycardia) (HCC) 01/2020   (a) 2 episdoes by monitor 01/2020     Medicines Meds ordered this encounter  Medications   ketorolac (TORADOL) 30 MG/ML injection 30 mg   oxyCODONE-acetaminophen (PERCOCET/ROXICET) 5-325 MG per tablet 1 tablet   lidocaine-EPINEPHrine (XYLOCAINE W/EPI) 2 %-1:200000 (PF) injection 20 mL   DISCONTD: indomethacin (INDOCIN) 50 MG capsule    Sig: Take 1 capsule (50 mg total) by mouth 2 (two) times daily with a meal.    Dispense:  30 capsule    Refill:  0   DISCONTD: oxyCODONE-acetaminophen (PERCOCET/ROXICET) 5-325 MG tablet    Sig: Take 1 tablet by mouth every 6 (six) hours as needed for severe pain.    Dispense:  10 tablet    Refill:  0   DISCONTD: indomethacin (INDOCIN) 50 MG capsule    Sig: Take 1 capsule (50 mg total) by mouth 2 (two) times daily with a meal.    Dispense:  30 capsule    Refill:  0   oxyCODONE-acetaminophen (PERCOCET/ROXICET) 5-325 MG tablet    Sig: Take 1 tablet by mouth every 6 (six) hours as needed for severe pain.    Dispense:  10 tablet    Refill:  0   DISCONTD: indomethacin (INDOCIN) 50 MG capsule    Sig: Take 1 capsule (50 mg total) by mouth 2 (two) times daily with a meal.    Dispense:  30 capsule    Refill:  0   indomethacin (INDOCIN) 50 MG capsule    Sig: Take 1 capsule (50 mg total) by mouth 2 (two) times daily with a meal.    Dispense:  30 capsule    Refill:  0    I have reviewed the patients home medicines and have made adjustments as needed  Problem List / ED Course: Problem List Items Addressed This Visit   None Visit Diagnoses     Knee effusion, left    -  Primary  Final Clinical Impression(s) / ED Diagnoses Final diagnoses:  Knee effusion, left    Rx / DC Orders ED Discharge Orders           Ordered    indomethacin (INDOCIN) 50 MG capsule  2 times daily with meals,   Status:  Discontinued        10/30/22 0432    oxyCODONE-acetaminophen (PERCOCET/ROXICET) 5-325 MG tablet  Every 6 hours PRN,   Status:  Discontinued        10/30/22 0432    indomethacin (INDOCIN) 50 MG capsule  2 times daily with meals,   Status:  Discontinued        10/30/22 0433    oxyCODONE-acetaminophen (PERCOCET/ROXICET) 5-325 MG tablet  Every 6 hours PRN        10/30/22 0433    indomethacin (INDOCIN) 50 MG capsule  2 times daily with meals,   Status:  Discontinued        10/30/22 0514    indomethacin (INDOCIN) 50 MG capsule  2 times daily with meals        10/30/22 0516              Jamelle Goldston, Mayer Masker, MD 10/30/22 [id number]    Shon Baton, MD 10/30/22 [phone]

## 2022-10-30 NOTE — Discharge Instructions (Signed)
I will call you with your results.  Follow-up with your primary doctor.  Ice and elevate your knee.  Take medications as prescribed.

## 2022-10-30 NOTE — ED Triage Notes (Signed)
Patient is from home A&Ox4. He was origanally having pain in his foot he took tylenol and motrin and that went away he is now having pain and swelling in his L knee. Pain is a 8-9 on a 1-10 pain scale when he moves it. Patient reports no injury or fall.

## 2022-10-31 LAB — BODY FLUID CULTURE W GRAM STAIN: Culture: NO GROWTH

## 2022-10-31 LAB — PROTEIN, BODY FLUID (OTHER): Total Protein, Body Fluid Other: 3.8 g/dL

## 2022-10-31 LAB — GLUCOSE, BODY FLUID OTHER: Glucose, Body Fluid Other: 26 mg/dL

## 2022-11-01 LAB — BODY FLUID CULTURE W GRAM STAIN

## 2022-11-02 LAB — BODY FLUID CULTURE W GRAM STAIN

## 2023-02-09 ENCOUNTER — Other Ambulatory Visit: Payer: Self-pay | Admitting: Physician Assistant

## 2023-03-13 ENCOUNTER — Other Ambulatory Visit: Payer: Self-pay | Admitting: Physician Assistant

## 2023-04-01 ENCOUNTER — Ambulatory Visit: Payer: 59 | Admitting: Physician Assistant

## 2023-04-01 NOTE — Progress Notes (Signed)
Cardiology Office Note Date:  04/07/2023  Patient ID:  Jeremy Roberson 11/16/1975, MRN [id number] PCP:  Blair Heys, MD (Inactive)  Cardiologist:  Dr. Mariah Milling Electrophysiologist: Dr. Lalla Brothers    Chief Complaint:  overdue 6 mo  History of Present Illness: Jeremy Roberson is a 47 y.o. male with history of palpitations, tachycardia  He was referred to Dr. Lalla Brothers Dec 2021 for palpitations, tachycardiac.  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 Zio tracings reviewed by Dr. 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 Flecainide started  If AAD ineffective alternative strategy an ablation  F/u stress test looked OK, no evidence of ischemic or conduction system prolongation.  F/u with Dr. Lalla Brothers Jan 2022, was feeling better, working less night shifts, happy with his symptom suppression If recurrent/breaththrough symptoms favired ablation though could consider 100mg  BID of Flecainide.  I saw him Nov 2022 He is doing well. 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  He is looking forward to it and very motivated to make some healthy lifestyle changes. He had once in the summer about 6hrs that his HR was stuck 130;s at rest, after a while makes him feel poorly, nauseous. This is the only episode of tachycardia he has had since starting the flecainide that he recalls He denies any CP, SOB, no near syncope or syncope Feels like he has good exertional capacity though with his weight has started to feel the aches/pains of being out of shape. He was on an exercise bike last week and started going the the gym some in preparation for the program and felt good exercising and better even afterwards. Stable EKG/intervals No changes were made.  I saw him 01/08/22 He is doing really well No SVTs Remains very active  especially at work No CP, palpitations or cardiac awareness No concerns today Just had his physical, had labs/lipids with his PMD Stable intervals No changes made, advised 6 mo visit  TODAY He is doing great, lost track of [place], has had 2 promotions, now the shift commander, has been a big transition, but enjoys the work. NO CP, palpitations No SVTs Struggled with gout this year some No dizzy spells, near syncope or syncope No SOB  AAD hx Flecainide started 2021   Past Medical History:  Diagnosis Date   Obesity    Palpitations    Paroxysmal SVT (supraventricular tachycardia) (HCC) 01/2020   (a) 2 episdoes by monitor 01/2020    Past Surgical History:  Procedure Laterality Date   LEG SURGERY      Current Outpatient Medications  Medication Sig Dispense Refill   allopurinol (ZYLOPRIM) 300 MG tablet Take 300 mg by mouth daily.     ibuprofen (ADVIL) 200 MG tablet Take by mouth as needed.     flecainide (TAMBOCOR) 50 MG tablet Take 1 tablet (50 mg total) by mouth 2 (two) times daily. 60 tablet 6   metoprolol succinate (TOPROL-XL) 25 MG 24 hr tablet Take 1 tablet (25 mg total) by mouth in the morning and at bedtime. 60 tablet 6   No current facility-administered medications for this visit.    Allergies:   Latex   Social History:  The patient  reports that he has never smoked. He has never used smokeless tobacco. He reports current alcohol use. He reports that he does  not use drugs.   Family History:  The patient's family history includes Atrial fibrillation in his mother.  ROS:  Please see the history of present illness.    All other systems are reviewed and otherwise negative.   PHYSICAL EXAM:  VS:  BP 116/72   Pulse 72   Ht 6' (1.829 m)   Wt (!) 306 lb (138.8 kg)   SpO2 98%   BMI 41.50 kg/m  BMI: Body mass index is 41.5 kg/m. Well nourished, well developed, in no acute distress HEENT: normocephalic, atraumatic Neck: no JVD, carotid bruits or masses Cardiac:  RRR;  no significant murmurs, no rubs, or gallops Lungs: CTA b/l, no wheezing, rhonchi or rales Abd: soft, nontender MS: no deformity or atrophy Ext: no edema Skin: warm and dry, no rash Neuro:  No gross deficits appreciated Psych: euthymic mood, full affect    EKG:  Done today and reviewed by myself shows  SR 72bpm, PR , QRS 92ms, QTc    2023: SR 64bpm, PR 204, QRS 96, Qtc 422 SB 55bpm, PR 196, QRS 96, Qtc 403 Stable intervals   12/142021: EST There was no ST segment deviation noted during stress.   Negative adequate stress test   03/07/2020: TTE IMPRESSIONS   1. Left ventricular ejection fraction, by estimation, is 60 to 65%. The  left ventricle has normal function. The left ventricle has no regional  wall motion abnormalities. Left ventricular diastolic parameters were  normal.   2. Right ventricular systolic function is normal. The right ventricular  size is normal.   3. The mitral valve is normal in structure. No evidence of mitral valve  regurgitation. No evidence of mitral stenosis.   4. The aortic valve is normal in structure. Aortic valve regurgitation is  not visualized. No aortic stenosis is present.    Recent Labs: No results found for requested labs within last 365 days.  No results found for requested labs within last 365 days.   CrCl cannot be calculated (Patient's most recent lab result is older than the maximum 21 days allowed.).   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  The  patient did not have any concerns regarding medicines.  Jeremy Fredrickson, PA-C 04/07/2023 8:26 AM     [facility] [address] [place] [address] [phone] (office)  [phone] (fax)

## 2023-04-07 ENCOUNTER — Ambulatory Visit: Payer: 59 | Attending: Physician Assistant | Admitting: Physician Assistant

## 2023-04-07 ENCOUNTER — Other Ambulatory Visit (HOSPITAL_COMMUNITY): Payer: Self-pay

## 2023-04-07 ENCOUNTER — Encounter: Payer: Self-pay | Admitting: Physician Assistant

## 2023-04-07 VITALS — BP 116/72 | HR 72 | Ht 72.0 in | Wt 306.0 lb

## 2023-04-07 DIAGNOSIS — T462X5D Adverse effect of other antidysrhythmic drugs, subsequent encounter: Secondary | ICD-10-CM | POA: Diagnosis not present

## 2023-04-07 DIAGNOSIS — T462X5A Adverse effect of other antidysrhythmic drugs, initial encounter: Secondary | ICD-10-CM

## 2023-04-07 DIAGNOSIS — I471 Supraventricular tachycardia, unspecified: Secondary | ICD-10-CM

## 2023-04-07 MED ORDER — METOPROLOL SUCCINATE ER 25 MG PO TB24: 25.0000 mg | ORAL_TABLET | Freq: Two times a day (BID) | ORAL | 6 refills | Status: DC

## 2023-04-07 MED ORDER — METOPROLOL SUCCINATE ER 25 MG PO TB24
25.0000 mg | ORAL_TABLET | Freq: Two times a day (BID) | ORAL | 6 refills | Status: DC
  Filled 2023-04-07: qty 60, 30d supply, fill #0

## 2023-04-07 MED ORDER — FLECAINIDE ACETATE 50 MG PO TABS
50.0000 mg | ORAL_TABLET | Freq: Two times a day (BID) | ORAL | 6 refills | Status: DC
  Filled 2023-04-07: qty 60, 30d supply, fill #0

## 2023-04-07 MED ORDER — FLECAINIDE ACETATE 50 MG PO TABS: 50.0000 mg | ORAL_TABLET | Freq: Two times a day (BID) | ORAL | 6 refills | Status: DC

## 2023-04-07 NOTE — Patient Instructions (Signed)
Medication Instructions:   Your physician recommends that you continue on your current medications as directed. Please refer to the Current Medication list given to you today.  *If you need a refill on your cardiac medications before your next appointment, please call your pharmacy*   Lab Work:  [place]   If you have labs (blood work) drawn today and your tests are completely normal, you will receive your results only by: [place] (if you have MyChart) OR A paper copy in the mail If you have any lab test that is abnormal or we need to change your treatment, we will call you to review the results.   Testing/Procedures: NONE ORDERED  TODAY    Follow-Up: At [facility], you and your health needs are our priority.  As part of our continuing mission to provide you with exceptional heart care, we have created designated Provider Care Teams.  These Care Teams include your primary Cardiologist (physician) and Advanced Practice Providers (APPs -  Physician Assistants and Nurse Practitioners) who all work together to provide you with the care you need, when you need it.  We recommend signing up for the patient portal called "MyChart".  Sign up information is provided on this After Visit Summary.  MyChart is used to connect with patients for Virtual Visits (Telemedicine).  Patients are able to view lab/test results, encounter notes, upcoming appointments, etc.  Non-urgent messages can be sent to your provider as well.   To learn more about what you can do with MyChart, go to [email or web address].    Your next appointment:   6 month(s)  Provider:   You may see Vickie Epley, MD or one of the following Advanced Practice Providers on your designated Care Team:   Tommye Standard, [place]  Other Instructions

## 2023-04-07 NOTE — Addendum Note (Signed)
Addended by: Oleta Mouse on: 04/07/2023 08:28 AM   Modules accepted: Orders

## 2023-12-08 NOTE — Progress Notes (Unsigned)
 Cardiology Office Note Date:  12/08/2023  Patient ID:  Jeremy, Roberson [birth year], MRN [id number] PCP:  Hugh Charleston, MD (Inactive)  Cardiologist:  Dr. Perla Electrophysiologist: Dr. Cindie    Chief Complaint:  overdue 6 mo  History of Present Illness: Jeremy Roberson is a 48 y.o. male with history of palpitations, tachycardia  He was referred to Dr. Cindie Dec 2021 for palpitations, tachycardiac.  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 Zio tracings reviewed by Dr. 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 Flecainide  started  If AAD ineffective alternative strategy an ablation  F/u stress test looked OK, no evidence of ischemic or conduction system prolongation.  F/u with Dr. Cindie Jan 2022, was feeling better, working less night shifts, happy with his symptom suppression If recurrent/breaththrough symptoms favired ablation though could consider 100mg  BID of Flecainide .  I saw him Nov 2022 He is doing well. 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  He is looking forward to it and very motivated to make some healthy lifestyle changes. He had once in the summer about 6hrs that his HR was stuck 130;s at rest, after a while makes him feel poorly, nauseous. This is the only episode of tachycardia he has had since starting the flecainide  that he recalls He denies any CP, SOB, no near syncope or syncope Feels like he has good exertional capacity though with his weight has started to feel the aches/pains of being out of shape. He was on an exercise bike last week and started going the the gym some in preparation for the program and felt good exercising and better even afterwards. Stable EKG/intervals No changes were made.  I saw him 01/08/22 He is doing really well No SVTs Remains very active especially  at work No CP, palpitations or cardiac awareness No concerns today Just had his physical, had labs/lipids with his PMD Stable intervals No changes made, advised 6 mo visit  I sw him Dec 2024 He is doing great, lost track of us , has had 2 promotions, now the shift commander, has been a big transition, but enjoys the work. NO CP, palpitations No SVTs Struggled with gout this year some No dizzy spells, near syncope or syncope No SOB Stable intervals No changes made Encouraged  more regular follow up  TODAY  He continues to do well. 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     flecainide  (TAMBOCOR ) 50 MG tablet Take 1 tablet (50 mg total) by mouth 2 (two) times daily. 60 tablet 6   ibuprofen  (ADVIL ) 200 MG tablet Take by mouth as needed.     metoprolol  succinate (TOPROL -XL) 25 MG 24 hr tablet Take 1 tablet (25 mg  total) by mouth in the morning and at bedtime. 60 tablet 6   No current facility-administered medications for this visit.    Allergies:   Latex   Social History:  The patient  reports that he has never smoked. He has never used smokeless tobacco. He reports current alcohol use. He reports that he does not use drugs.   Family History:  The patient's family history includes Atrial fibrillation in his mother.  ROS:  Please see the history of present illness.    All other systems are reviewed and  otherwise negative.   PHYSICAL EXAM:  VS:  There were no vitals taken for this visit. BMI: There is no height or weight on file to calculate BMI. Well nourished, well developed, in no acute distress HEENT: normocephalic, atraumatic Neck: no JVD, carotid bruits or masses Cardiac: RRR; no significant murmurs, no rubs, or gallops Lungs: CTA b/l, no wheezing, rhonchi or rales Abd: soft, nontender MS: no deformity or atrophy Ext:  no edema Skin: warm and dry, no rash Neuro:  No gross deficits appreciated Psych: euthymic mood, full affect    EKG:  Done today and reviewed by myself shows               SR 67bpm, PR , QRS 98ms, QTc  04/07/23: SR 72bpm, PR , QRS 92ms, QTc   2023: SR 64bpm, PR 204, QRS 96, Qtc 422 SB 55bpm, PR 196, QRS 96, Qtc 403 Stable intervals   12/142021: EST There was no ST segment deviation noted during stress.   Negative adequate stress test   03/07/2020: TTE IMPRESSIONS   1. Left ventricular ejection fraction, by estimation, is 60 to 65%. The  left ventricle has normal function. The left ventricle has no regional  wall motion abnormalities. Left ventricular diastolic parameters were  normal.   2. Right ventricular systolic function is normal. The right ventricular  size is normal.   3. The mitral valve is normal in structure. No evidence of mitral valve  regurgitation. No evidence of mitral stenosis.   4. The aortic valve is normal in structure. Aortic valve regurgitation is  not visualized. No aortic stenosis is present.    Recent Labs: No results found for requested labs within last 365 days.  No results found for requested labs within last 365 days.   CrCl cannot be calculated (Patient's most recent lab result is older than the maximum 21 days allowed.).   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  The patient did not have any concerns regarding medicines.  Bonney Charlies Arthur, PA-C 12/08/2023 8:38 AM     [facility] [address] [place] [address] [phone] (office)  [phone] (fax)

## 2023-12-10 ENCOUNTER — Encounter: Payer: Self-pay | Admitting: Physician Assistant

## 2023-12-10 ENCOUNTER — Ambulatory Visit: Attending: Physician Assistant | Admitting: Physician Assistant

## 2023-12-10 VITALS — BP 110/66 | HR 67 | Ht 72.0 in | Wt 291.6 lb

## 2023-12-10 DIAGNOSIS — I471 Supraventricular tachycardia, unspecified: Secondary | ICD-10-CM | POA: Diagnosis not present

## 2023-12-10 DIAGNOSIS — Z5181 Encounter for therapeutic drug level monitoring: Secondary | ICD-10-CM

## 2023-12-10 DIAGNOSIS — Z79899 Other long term (current) drug therapy: Secondary | ICD-10-CM | POA: Diagnosis not present

## 2023-12-10 MED ORDER — METOPROLOL SUCCINATE ER 25 MG PO TB24: 25.0000 mg | ORAL_TABLET | Freq: Two times a day (BID) | ORAL | 3 refills | Status: AC

## 2023-12-10 MED ORDER — FLECAINIDE ACETATE 50 MG PO TABS: 50.0000 mg | ORAL_TABLET | Freq: Two times a day (BID) | ORAL | 3 refills | Status: AC

## 2023-12-10 NOTE — Patient Instructions (Signed)
 Medication Instructions:   Your physician recommends that you continue on your current medications as directed. Please refer to the Current Medication list given to you today.  *If you need a refill on your cardiac medications before your next appointment, please call your pharmacy*   Lab Work: NONE ORDERED  TODAY   If you have labs (blood work) drawn today and your tests are completely normal, you will receive your results only by: MyChart Message (if you have MyChart) OR A paper copy in the mail If you have any lab test that is abnormal or we need to change your treatment, we will call you to review the results.  Testing/Procedures: NONE ORDERED  TODAY    Follow-Up: At [facility], you and your health needs are our priority.  As part of our continuing mission to provide you with exceptional heart care, our providers are all part of one team.  This team includes your primary Cardiologist (physician) and Advanced Practice Providers or APPs (Physician Assistants and Nurse Practitioners) who all work together to provide you with the care you need, when you need it.  Your next appointment:    6 month(s)  Provider:   Ole Holts, MD or Charlies Arthur, PA-C    We recommend signing up for the patient portal called MyChart.  Sign up information is provided on this After Visit Summary.  MyChart is used to connect with patients for Virtual Visits (Telemedicine).  Patients are able to view lab/test results, encounter notes, upcoming appointments, etc.  Non-urgent messages can be sent to your provider as well.   To learn more about what you can do with MyChart, go to [email or web address].   Other Instructions

## 2024-06-01 NOTE — Progress Notes (Signed)
 "    Cardiology Office Note Date:  06/01/2024  Patient ID:  Jeremy Roberson 1975/07/06, MRN [id number] PCP:  Auston Opal, DO  Cardiologist:  Dr. Perla Electrophysiologist: Dr. Cindie (inactive)    Chief Complaint:  6 mo/flecainide  visit  History of Present Illness: Jeremy Roberson is a 49 y.o. male with history of  palpitations, tachycardia >> ATach  He was referred to Dr. Cindie Dec 2021 for palpitations, tachycardiac.  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 Zio tracings reviewed by Dr. 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 Flecainide  started  If AAD ineffective alternative strategy an ablation  F/u stress test looked OK, no evidence of ischemic or conduction system prolongation.  F/u with Dr. Cindie Jan 2022, was feeling better, working less night shifts, happy with his symptom suppression If recurrent/breaththrough symptoms favired ablation though could consider 100mg  BID of Flecainide .  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 NO CP, palpitations No SVTs Struggled with gout this year some No dizzy spells, near syncope or syncope No SOB Stable intervals No changes made Encouraged  more regular follow up  12/10/23 He continues to do well. 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 No CP, palpitations or cardiac awareness No SOB, DOE No near syncope or syncope, no dizzy spells.  He is [employer], mostly management now, but remains active with good exertional capacity No real formal exercise   AAD hx Flecainide  started 2021   Past Medical History:  Diagnosis Date   Obesity    Palpitations    Paroxysmal SVT (supraventricular tachycardia) 01/2020   (a) 2 episdoes by monitor 01/2020    Past Surgical History:  Procedure Laterality Date   LEG SURGERY      Current Outpatient Medications  Medication Sig Dispense Refill   allopurinol (ZYLOPRIM) 300 MG tablet Take 300 mg by mouth daily.     flecainide  (TAMBOCOR ) 50 MG tablet Take 1 tablet (50 mg total) by mouth 2 (two) times daily. 180 tablet 3   ibuprofen  (ADVIL ) 200 MG tablet Take by mouth as needed.     metoprolol  succinate (TOPROL -XL) 25 MG 24 hr tablet Take 1 tablet (25 mg total) by mouth in the morning and at bedtime. 180 tablet 3   No current facility-administered medications for this visit.    Allergies:   Latex   Social History:  The patient  reports that he has never smoked. He has never used smokeless tobacco. He reports current alcohol use. He reports that he does not use drugs.   Family History:  The patient's family history includes Atrial fibrillation in his mother.  ROS:  Please see the history of present illness.    All other systems are reviewed and otherwise negative.   PHYSICAL EXAM:  VS:  There were no vitals taken for this visit. BMI: There is no height or weight on file to  calculate BMI. Well nourished, well developed, in no acute distress HEENT: normocephalic, atraumatic Neck: no JVD, carotid bruits or masses Cardiac:  RRR; no significant murmurs, no rubs, or gallops Lungs: CTA b/l, no wheezing, rhonchi or rales Abd: soft, nontender MS: no deformity or atrophy Ext: no edema Skin: warm and dry, no rash Neuro:  No gross deficits appreciated Psych: euthymic mood, full affect    EKG:  Done today and reviewed by myself shows  SR 62bpm, PR , QRS 96ms, QTc                12/10/23: SR 67bpm, PR , QRS 98ms, QTc 04/07/23: SR 72bpm, PR , QRS 92ms, QTc   2023: SR 64bpm, PR 204, QRS 96, Qtc 422 SB 55bpm, PR 196, QRS 96, Qtc 403 Stable intervals   12/142021: EST There was no ST segment deviation noted during stress.   Negative adequate stress test   03/07/2020: TTE IMPRESSIONS   1. Left ventricular ejection fraction, by estimation, is 60 to 65%. The  left ventricle has normal function. The left ventricle has no regional  wall motion abnormalities. Left ventricular diastolic parameters were  normal.   2. Right ventricular systolic function is normal. The right ventricular  size is normal.   3. The mitral valve is normal in structure. No evidence of mitral valve  regurgitation. No evidence of mitral stenosis.   4. The aortic valve is normal in structure. Aortic valve regurgitation is  not visualized. No aortic stenosis is present.    Recent Labs: No results found for requested labs within last 365 days.  No results found for requested labs within last 365 days.   CrCl cannot be calculated (Patient's most recent lab result is older than the maximum 21 days allowed.).   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 Hiram departure from our practice, will plan transition to Dr. Almetta  Disposition: back in 39mo, sooner if needed  Current medicines are reviewed at length with the patient today.  The patient did not have any concerns regarding medicines.  Bonney Charlies Arthur, PA-C 06/01/2024 2:21 PM     [facility] [address] [place] [address] [phone] (office)   [phone] (fax)  "

## 2024-06-08 ENCOUNTER — Ambulatory Visit: Admitting: Physician Assistant

## 2024-06-08 VITALS — BP 110/64 | HR 62 | Ht 72.0 in | Wt 313.0 lb

## 2024-06-08 DIAGNOSIS — I471 Supraventricular tachycardia, unspecified: Secondary | ICD-10-CM

## 2024-06-08 DIAGNOSIS — Z79899 Other long term (current) drug therapy: Secondary | ICD-10-CM

## 2024-06-08 DIAGNOSIS — Z5181 Encounter for therapeutic drug level monitoring: Secondary | ICD-10-CM

## 2024-06-08 NOTE — Patient Instructions (Signed)
 Medication Instructions:   Your physician recommends that you continue on your current medications as directed. Please refer to the Current Medication list given to you today.  *If you need a refill on your cardiac medications before your next appointment, please call your pharmacy*   Lab Work: NONE ORDERED  TODAY    If you have labs (blood work) drawn today and your tests are completely normal, you will receive your results only by: MyChart Message (if you have MyChart) OR A paper copy in the mail If you have any lab test that is abnormal or we need to change your treatment, we will call you to review the results.  Testing/Procedures: NONE ORDERED  TODAY    Follow-Up: At [facility], you and your health needs are our priority.  As part of our continuing mission to provide you with exceptional heart care, our providers are all part of one team.  This team includes your primary Cardiologist (physician) and Advanced Practice Providers or APPs (Physician Assistants and Nurse Practitioners) who all work together to provide you with the care you need, when you need it.  Your next appointment:   6 month(s)  Provider:   Donnice Primus, MD    We recommend signing up for the patient portal called MyChart.  Sign up information is provided on this After Visit Summary.  MyChart is used to connect with patients for Virtual Visits (Telemedicine).  Patients are able to view lab/test results, encounter notes, upcoming appointments, etc.  Non-urgent messages can be sent to your provider as well.   To learn more about what you can do with MyChart, go to [email or web address].   Other Instructions
